# Patient Record
Sex: Female | Born: 1935
Health system: Southern US, Community
[De-identification: ages and names within clinical notes are randomized; demographics above are authoritative.]

## PROBLEM LIST (undated history)

## (undated) DIAGNOSIS — K219 Gastro-esophageal reflux disease without esophagitis: Secondary | ICD-10-CM

## (undated) DIAGNOSIS — R55 Syncope and collapse: Secondary | ICD-10-CM

## (undated) DIAGNOSIS — H409 Unspecified glaucoma: Secondary | ICD-10-CM

## (undated) HISTORY — PX: ABDOMINAL HYSTERECTOMY: SHX81

## (undated) HISTORY — PX: EYE SURGERY: SHX253

---

## 2004-10-13 ENCOUNTER — Ambulatory Visit: Payer: Self-pay | Admitting: Internal Medicine

## 2005-10-27 ENCOUNTER — Ambulatory Visit: Payer: Self-pay | Admitting: Internal Medicine

## 2006-02-10 ENCOUNTER — Ambulatory Visit: Payer: Self-pay | Admitting: Internal Medicine

## 2006-11-09 ENCOUNTER — Ambulatory Visit: Payer: Self-pay | Admitting: Internal Medicine

## 2007-11-15 ENCOUNTER — Ambulatory Visit: Payer: Self-pay | Admitting: Internal Medicine

## 2009-01-01 ENCOUNTER — Ambulatory Visit: Payer: Self-pay | Admitting: Internal Medicine

## 2009-04-03 ENCOUNTER — Ambulatory Visit: Payer: Self-pay | Admitting: Unknown Physician Specialty

## 2009-10-07 ENCOUNTER — Inpatient Hospital Stay: Payer: Self-pay | Admitting: Internal Medicine

## 2010-01-06 ENCOUNTER — Ambulatory Visit: Payer: Self-pay | Admitting: Internal Medicine

## 2010-08-08 ENCOUNTER — Emergency Department: Payer: Self-pay | Admitting: Unknown Physician Specialty

## 2010-09-20 ENCOUNTER — Ambulatory Visit: Payer: Self-pay | Admitting: Ophthalmology

## 2011-01-17 ENCOUNTER — Ambulatory Visit: Payer: Self-pay | Admitting: Internal Medicine

## 2011-07-25 ENCOUNTER — Ambulatory Visit: Payer: Self-pay | Admitting: Internal Medicine

## 2011-07-28 ENCOUNTER — Ambulatory Visit: Payer: Self-pay | Admitting: Internal Medicine

## 2011-11-07 ENCOUNTER — Ambulatory Visit: Payer: Self-pay | Admitting: Otolaryngology

## 2012-01-26 ENCOUNTER — Ambulatory Visit: Payer: Self-pay | Admitting: Unknown Physician Specialty

## 2012-02-23 ENCOUNTER — Ambulatory Visit: Payer: Self-pay | Admitting: Internal Medicine

## 2013-05-08 ENCOUNTER — Ambulatory Visit: Payer: Self-pay | Admitting: Internal Medicine

## 2013-06-25 ENCOUNTER — Observation Stay: Payer: Self-pay | Admitting: Internal Medicine

## 2013-06-25 LAB — URINALYSIS, COMPLETE
Bacteria: NONE SEEN
Bilirubin,UR: NEGATIVE
Blood: NEGATIVE
Glucose,UR: NEGATIVE mg/dL (ref 0–75)
Ph: 7 (ref 4.5–8.0)
Protein: NEGATIVE
RBC,UR: 1 /HPF (ref 0–5)
Squamous Epithelial: 1

## 2013-06-25 LAB — COMPREHENSIVE METABOLIC PANEL
Albumin: 3.9 g/dL (ref 3.4–5.0)
Alkaline Phosphatase: 152 U/L — ABNORMAL HIGH (ref 50–136)
Anion Gap: 7 (ref 7–16)
BUN: 9 mg/dL (ref 7–18)
Calcium, Total: 9.4 mg/dL (ref 8.5–10.1)
Co2: 26 mmol/L (ref 21–32)
Creatinine: 0.73 mg/dL (ref 0.60–1.30)
EGFR (African American): >60
EGFR (Non-African Amer.): >60
Glucose: 135 mg/dL — ABNORMAL HIGH (ref 65–99)
Osmolality: 263 (ref 275–301)
Potassium: 4 mmol/L (ref 3.5–5.1)
Sodium: 131 mmol/L — ABNORMAL LOW (ref 136–145)
Total Protein: 8 g/dL (ref 6.4–8.2)

## 2013-06-25 LAB — CK TOTAL AND CKMB (NOT AT ARMC): CK, Total: 44 U/L (ref 21–215)

## 2013-06-25 LAB — CBC
HCT: 43.2 % (ref 35.0–47.0)
MCHC: 33.8 g/dL (ref 32.0–36.0)
RBC: 4.61 10*6/uL (ref 3.80–5.20)

## 2013-06-25 LAB — TROPONIN I: Troponin-I: <0.02 ng/mL

## 2013-06-26 DIAGNOSIS — R55 Syncope and collapse: Secondary | ICD-10-CM

## 2013-06-26 LAB — CBC WITH DIFFERENTIAL/PLATELET
HCT: 35.7 % (ref 35.0–47.0)
HGB: 12.5 g/dL (ref 12.0–16.0)
Lymphocyte %: 16 %
MCHC: 35 g/dL (ref 32.0–36.0)
MCV: 95 fL (ref 80–100)
Monocyte %: 11.2 %
Neutrophil #: 4.6 10*3/uL (ref 1.4–6.5)
RBC: 3.76 10*6/uL — ABNORMAL LOW (ref 3.80–5.20)
RDW: 13 % (ref 11.5–14.5)
WBC: 6.6 10*3/uL (ref 3.6–11.0)

## 2013-06-26 LAB — BASIC METABOLIC PANEL
Anion Gap: 3 — ABNORMAL LOW (ref 7–16)
Calcium, Total: 8.4 mg/dL — ABNORMAL LOW (ref 8.5–10.1)
Chloride: 103 mmol/L (ref 98–107)
Co2: 28 mmol/L (ref 21–32)
Creatinine: 0.67 mg/dL (ref 0.60–1.30)
EGFR (Non-African Amer.): >60
Potassium: 3.9 mmol/L (ref 3.5–5.1)
Sodium: 134 mmol/L — ABNORMAL LOW (ref 136–145)

## 2013-06-26 LAB — MAGNESIUM: Magnesium: 1.8 mg/dL

## 2013-06-26 LAB — PROTIME-INR: Prothrombin Time: 14.2 secs (ref 11.5–14.7)

## 2013-06-30 LAB — CULTURE, BLOOD (SINGLE)

## 2014-07-06 ENCOUNTER — Emergency Department: Payer: Self-pay | Admitting: Emergency Medicine

## 2014-07-06 LAB — PRO B NATRIURETIC PEPTIDE: B-TYPE NATIURETIC PEPTID: 264 pg/mL (ref 0–450)

## 2014-07-06 LAB — CBC
HCT: 47.7 % — ABNORMAL HIGH (ref 35.0–47.0)
HGB: 15.6 g/dL (ref 12.0–16.0)
MCH: 31.4 pg (ref 26.0–34.0)
MCHC: 32.6 g/dL (ref 32.0–36.0)
MCV: 96 fL (ref 80–100)
Platelet: 250 10*3/uL (ref 150–440)
RBC: 4.96 10*6/uL (ref 3.80–5.20)
RDW: 13.1 % (ref 11.5–14.5)
WBC: 6.1 10*3/uL (ref 3.6–11.0)

## 2014-07-06 LAB — COMPREHENSIVE METABOLIC PANEL
ALBUMIN: 3.9 g/dL (ref 3.4–5.0)
ANION GAP: 9 (ref 7–16)
AST: 29 U/L (ref 15–37)
Alkaline Phosphatase: 137 U/L — ABNORMAL HIGH
BILIRUBIN TOTAL: 0.6 mg/dL (ref 0.2–1.0)
BUN: 10 mg/dL (ref 7–18)
CALCIUM: 9.5 mg/dL (ref 8.5–10.1)
CHLORIDE: 96 mmol/L — AB (ref 98–107)
Co2: 28 mmol/L (ref 21–32)
Creatinine: 0.83 mg/dL (ref 0.60–1.30)
GLUCOSE: 95 mg/dL (ref 65–99)
Osmolality: 265 (ref 275–301)
POTASSIUM: 3.9 mmol/L (ref 3.5–5.1)
SGPT (ALT): 30 U/L
Sodium: 133 mmol/L — ABNORMAL LOW (ref 136–145)
Total Protein: 8.4 g/dL — ABNORMAL HIGH (ref 6.4–8.2)

## 2014-07-06 LAB — URINALYSIS, COMPLETE
BLOOD: NEGATIVE
Bacteria: NONE SEEN
Bilirubin,UR: NEGATIVE
Glucose,UR: NEGATIVE mg/dL (ref 0–75)
Ketone: NEGATIVE
Nitrite: NEGATIVE
PH: 7 (ref 4.5–8.0)
PROTEIN: NEGATIVE
RBC,UR: 4 /HPF (ref 0–5)
Specific Gravity: 1.017 (ref 1.003–1.030)
Squamous Epithelial: 3
WBC UR: 118 /HPF (ref 0–5)

## 2014-07-06 LAB — TROPONIN I: Troponin-I: <0.02 ng/mL

## 2014-07-08 LAB — URINE CULTURE

## 2014-08-14 ENCOUNTER — Ambulatory Visit: Payer: Self-pay | Admitting: Internal Medicine

## 2015-03-20 NOTE — Consult Note (Signed)
Brief Consult Note: Diagnosis: Syncope.   Patient was seen by consultant.   Consult note dictated.   Recommend further assessment or treatment.   Orders entered.   Discussed with Attending MD.   Comments: IMP CHF on CXR Syncope Probable vasovagal uti? GERD Dehydration . PLAN Tele ROMI ECHO Carotid dopplers Agree with Ct of brain Rec conservative cardiacinvolvement for now No clear indication for EP or PPM.  Electronic Signatures: Lujean Amel D (MD)  (Signed 30-Jul-14 07:29)  Authored: Brief Consult Note   Last Updated: 30-Jul-14 07:29 by Yolonda Kida (MD)

## 2015-03-20 NOTE — H&P (Signed)
PATIENT NAME:  Robin Fernandez, Robin Fernandez MR#:  [id number] DATE OF BIRTH:  1929/08/18  DATE OF ADMISSION:  06/25/2013  PRIMARY CARE PHYSICIAN: Used to be Dr. Apolonio Schneiders, but the patient got an appointment to see Dr. Emily Filbert.  CHIEF COMPLAINT:  Syncope.   HISTORY OF PRESENT ILLNESS: This is an 81 year old female with history of GERD, recently with antibiotics for a UTI.  Finished Macrodantin for 7 days yesterday. Last night the patient woke up to use the bathroom.  After she urinated, she felt like she had fever so she checked the temperature; it was around 100.4. She wanted to note down it, but she passed out. The patient lost consciousness for a few seconds.  Husband was there at bedside. The patient was brought in for that. The patient does not have any orthostatic blood pressure changes here, but feels very tired. Denies any chest pain. No trouble breathing. No dizziness. Now the patient is in sinus rhythm with rate 90 beats per minute. The patient is alert, awake and oriented. Does not have any weakness or dysphagia or facial droop.    PAST MEDICAL HISTORY:  Significant for GERD and glaucoma.   ALLERGIES: AMOXicillin,> ERYTHROMYCIN, LUMIGAN, PARAFON FORTE, PREVACID AND XALATAN.   PAST SURGICAL HISTORY: Significant for a hysterectomy and glaucoma surgery.   FAMILY HISTORY: Significant for father who had a massive MI.  Mother had pacemaker.   SOCIAL HISTORY:  No smoking.  Occasional drinking. No drugs. The patient right now lives with husband.   MEDICATIONS:  Used to be on blood pressure medications, but they were stopped.  HOME MEDICATIONS: Include: 1.  Omeprazole 20 mg delayed release daily. 2.  Travatan 0.004% eye drops in affected eye in the evening.   REVIEW OF SYSTEMS: CONSTITUTIONAL: Feels weak and also has noticed low-grade temp last night.  EYES: No blurred vision. The patient felt dizzy last night.  ENT: No tinnitus. No epistaxis. No difficulty swallowing.  RESPIRATIONS: Has  chronic cough because of GERD.  CARDIOVASCULAR: No chest pain. No orthopnea.  GASTROINTESTINAL: No nausea. No vomiting. No abdominal pain.  GENITOURINARY: No dysuria.  Recently treated with antibiotics for UTI.  INTEGUMENT: No skin rash.  MUSCULOSKELETAL: No joint pain.  NEUROLOGIC: No numbness. No weakness. No dysarthria.  PSYCHIATRIC: No anxiety or insomnia.   PHYSICAL EXAMINATION: VITAL SIGNS: Temperature 99.3, heart rate 94, blood pressure 156/81, sats 97% on room air.  GENERAL: Alert, awake and oriented. Not in distress. Well-developed, well-nourished female.  HEAD:  Normocephalic, atraumatic.  EYES: Pallor.  EOMs intact. No conjunctival pallor. No scleral icterus.  NOSE: No nasal lesions.  EARS: No drainage. No external lesions. No exudates.  NECK: Supple. No JVD. Nontender. Full range of motion. LUNGS:  Clear to auscultation. No wheeze. No rales.  HEART:  S1 and S2 regular, slightly tachycardic. No murmurs. PMI not displaced. No peripheral edema. Good femoral and pedal pulses. ABDOMEN:  Soft, slightly distended. Bowel sounds present. No organomegaly.  MUSCULOSKELETAL: The patient's extremities move x 4.  Range of motion is adequate. Strength is equal bilaterally.  SKIN: Warm and dry. No diaphoresis. No obvious wounds.  NEUROLOGIC: Alert, awake and oriented. Cranial nerves II through XII intact. Power 5/5 in upper and lower extremities. Sensation is intact. DTRs 2+ bilaterally. Power 5/5 in upper and lower extremities. The patient has no dysarthria.  PSYCHIATRIC: Oriented to time, place and person.   LABORATORY AND DIAGNOSTICS:  The patient's troponin is less than 0.02. CK total is 44, CPK/MB 1.5. WBC 10.7,  hemoglobin 14.6, hematocrit 43.2, platelets 228.   Chest x-ray shows mild CHF versus pneumonitis.  CAT scan of the head shows no acute intracranial abnormality.   Urinalysis is clear with 1+ leukocyte esterase, but bacteria none. WBC is only 2.   EKG: Sinus rhythm with some  PVCs, 98 beats per minute. No ST-T changes.   ASSESSMENT AND PLAN: 1.  The patient is an 81 year old female with syncope likely secondary to vasovagal.  The patient's urine looks clear. No evidence of infection. We are going to evaluate her for 24 hours and check orthostatic vitals.  Also evaluate for occult infection.  The patient complains of some cough and pneumonitis found on x-ray, but she has already received nitrofurantoin and she is started on Rocephin by ER physician. We can probably continue the Rocephin for 2 days and see how she does.  2.  Gastroesophageal reflux disease. Continue proton pump inhibitors. 3.  The patient has glaucoma. Continue home medications.   4.  Regarding syncope, we will get an echocardiogram and carotid ultrasound. We will hold off on the cardiology consult at this time.   The patient's condition discussed with the daughter.  Will sign off to Dr. Emily Filbert.  TIME SPENT: 55 minutes. ____________________________ Epifanio Lesches, MD sk:sb D: 06/25/2013 12:51:42 ET T: 06/25/2013 13:04:20 ET JOB#: [id number]  cc: Epifanio Lesches, MD, <Dictator> Epifanio Lesches MD ELECTRONICALLY SIGNED 07/16/2013 16:02

## 2015-03-20 NOTE — Discharge Summary (Signed)
PATIENT NAME:  Robin Fernandez, Robin Fernandez MR#:  [id number] DATE OF BIRTH:  1929/07/27  DATE OF ADMISSION:  06/25/2013 DATE OF DISCHARGE:    DISCHARGE DIAGNOSES:  1.  Syncope, vasovagal.  2.  Fever with urinary tract infection.  3.  Reflux esophagitis.  4.  Irritable bowel syndrome.  5.  Chronically elevated alkaline phosphatase.  6.  Glaucoma.   DISCHARGE MEDICATIONS: Travatan 0.004%, 1 drop to left eye daily, omeprazole 20 mg daily and Cipro 250 mg twice daily x 7 days.   REASON FOR ADMISSION: This 81 year old female presents with syncope and fever. Please see H and P for history of present illness, past medical history and physical exam.   HOSPITAL COURSE: The patient was admitted, ruled out for MI. Lab work normal. Her orthostasis resolved with hydration. She was stable on her feet with physical therapy. Urinary tract infection was treated with Rocephin and then she will be going home on Cipro. No pneumonia. No CHF.   PROGNOSIS: Good.   FOLLOWUP: In 1 week.  ____________________________ Rusty Aus, MD mfm:aw D: 06/27/2013 07:57:25 ET T: 06/27/2013 08:04:16 ET JOB#: [id number]  cc: Rusty Aus, MD, <Dictator> MARK Roselee Culver MD ELECTRONICALLY SIGNED 06/28/2013 8:14

## 2016-01-06 DIAGNOSIS — H40053 Ocular hypertension, bilateral: Secondary | ICD-10-CM | POA: Diagnosis not present

## 2016-01-21 DIAGNOSIS — M79674 Pain in right toe(s): Secondary | ICD-10-CM | POA: Diagnosis not present

## 2016-01-21 DIAGNOSIS — M2041 Other hammer toe(s) (acquired), right foot: Secondary | ICD-10-CM | POA: Diagnosis not present

## 2016-01-21 DIAGNOSIS — B351 Tinea unguium: Secondary | ICD-10-CM | POA: Diagnosis not present

## 2016-01-21 DIAGNOSIS — M79675 Pain in left toe(s): Secondary | ICD-10-CM | POA: Diagnosis not present

## 2016-01-21 DIAGNOSIS — M2042 Other hammer toe(s) (acquired), left foot: Secondary | ICD-10-CM | POA: Diagnosis not present

## 2016-02-08 ENCOUNTER — Emergency Department
Admission: EM | Admit: 2016-02-08 | Discharge: 2016-02-08 | Disposition: A | Discharge status: Home / Self Care | Payer: PPO | Attending: Emergency Medicine | Admitting: Emergency Medicine

## 2016-02-08 ENCOUNTER — Encounter: Payer: Self-pay | Admitting: Emergency Medicine

## 2016-02-08 DIAGNOSIS — E538 Deficiency of other specified B group vitamins: Secondary | ICD-10-CM | POA: Diagnosis not present

## 2016-02-08 DIAGNOSIS — Z87891 Personal history of nicotine dependence: Secondary | ICD-10-CM | POA: Diagnosis not present

## 2016-02-08 DIAGNOSIS — I10 Essential (primary) hypertension: Secondary | ICD-10-CM | POA: Diagnosis not present

## 2016-02-08 DIAGNOSIS — R55 Syncope and collapse: Secondary | ICD-10-CM | POA: Diagnosis not present

## 2016-02-08 HISTORY — DX: Gastro-esophageal reflux disease without esophagitis: K21.9

## 2016-02-08 HISTORY — DX: Unspecified glaucoma: H40.9

## 2016-02-08 LAB — CBC
HCT: 38.9 % (ref 35.0–47.0)
HEMOGLOBIN: 13.3 g/dL (ref 12.0–16.0)
MCH: 32.2 pg (ref 26.0–34.0)
MCHC: 34.2 g/dL (ref 32.0–36.0)
MCV: 94.3 fL (ref 80.0–100.0)
PLATELETS: 222 10*3/uL (ref 150–440)
RBC: 4.13 MIL/uL (ref 3.80–5.20)
RDW: 12.9 % (ref 11.5–14.5)
WBC: 4.8 10*3/uL (ref 3.6–11.0)

## 2016-02-08 LAB — COMPREHENSIVE METABOLIC PANEL
ALK PHOS: 99 U/L (ref 38–126)
ALT: 18 U/L (ref 14–54)
AST: 29 U/L (ref 15–41)
Albumin: 3.7 g/dL (ref 3.5–5.0)
Anion gap: 7 (ref 5–15)
BUN: 15 mg/dL (ref 6–20)
CALCIUM: 8.8 mg/dL — AB (ref 8.9–10.3)
CO2: 27 mmol/L (ref 22–32)
CREATININE: 0.74 mg/dL (ref 0.44–1.00)
Chloride: 100 mmol/L — ABNORMAL LOW (ref 101–111)
Glucose, Bld: 127 mg/dL — ABNORMAL HIGH (ref 65–99)
Potassium: 3.3 mmol/L — ABNORMAL LOW (ref 3.5–5.1)
Sodium: 134 mmol/L — ABNORMAL LOW (ref 135–145)
Total Bilirubin: 1 mg/dL (ref 0.3–1.2)
Total Protein: 6.6 g/dL (ref 6.5–8.1)

## 2016-02-08 LAB — TROPONIN I

## 2016-02-08 MED ORDER — SODIUM CHLORIDE 0.9 % IV SOLN
1000.0000 mL | Freq: Once | INTRAVENOUS | Status: AC
  Administered 2016-02-08: 1000 mL via INTRAVENOUS

## 2016-02-08 NOTE — Discharge Instructions (Signed)
Syncope, commonly known as fainting, is a temporary loss of consciousness. It occurs when the blood flow to the brain is reduced. Vasovagal syncope (also called neurocardiogenic syncope) is a fainting spell in which the blood flow to the brain is reduced because of a sudden drop in heart rate and blood pressure. Vasovagal syncope occurs when the brain and the cardiovascular system (blood vessels) do not adequately communicate and respond to each other. This is the most common cause of fainting. It often occurs in response to fear or some other type of emotional or physical stress. The body has a reaction in which the heart starts beating too slowly or the blood vessels expand, reducing blood pressure. This type of fainting spell is generally considered harmless. However, injuries can occur if a person takes a sudden fall during a fainting spell.   CAUSES   Vasovagal syncope occurs when a person's blood pressure and heart rate decrease suddenly, usually in response to a trigger. Many things and situations can trigger an episode. Some of these include:    Pain.    Fear.    The sight of blood or medical procedures, such as blood being drawn from a vein.    Common activities, such as coughing, swallowing, stretching, or going to the bathroom.    Emotional stress.    Prolonged standing, especially in a warm environment.    Lack of sleep or rest.    Prolonged lack of food.    Prolonged lack of fluids.    Recent illness.   The use of certain drugs that affect blood pressure, such as cocaine, alcohol, marijuana, inhalants, and opiates.   SYMPTOMS   Before the fainting episode, you may:    Feel dizzy or light headed.    Become pale.   Sense that you are going to faint.    Feel like the room is spinning.    Have tunnel vision, only seeing directly in front of you.    Feel sick to your stomach (nauseous).    See spots or slowly lose vision.    Hear ringing in your ears.    Have a headache.     Feel warm and sweaty.    Feel a sensation of pins and needles.  During the fainting spell, you will generally be unconscious for no longer than a couple minutes before waking up and returning to normal. If you get up too quickly before your body can recover, you may faint again. Some twitching or jerky movements may occur during the fainting spell.   DIAGNOSIS   Your health care provider will ask about your symptoms, take a medical history, and perform a physical exam. Various tests may be done to rule out other causes of fainting. These may include blood tests and tests to check the heart, such as electrocardiography, echocardiography, and possibly an electrophysiology study. When other causes have been ruled out, a test may be done to check the body's response to changes in position (tilt table test).  TREATMENT   Most cases of vasovagal syncope do not require treatment. Your health care provider may recommend ways to avoid fainting triggers and may provide home strategies for preventing fainting. If you must be exposed to a possible trigger, you can drink additional fluids to help reduce your chances of having an episode of vasovagal syncope. If you have warning signs of an oncoming episode, you can respond by positioning yourself favorably (lying down).  If your fainting spells continue, you may be   given medicines to prevent fainting. Some medicines may help make you more resistant to repeated episodes of vasovagal syncope. Special exercises or compression stockings may be recommended. In rare cases, the surgical placement of a pacemaker is considered.  HOME CARE INSTRUCTIONS    Learn to identify the warning signs of vasovagal syncope.    Sit or lie down at the first warning sign of a fainting spell. If sitting, put your head down between your legs. If you lie down, swing your legs up in the air to increase blood flow to the brain.    Avoid hot tubs and saunas.   Avoid prolonged standing.   Drink  enough fluids to keep your urine clear or pale yellow. Avoid caffeine.   Increase salt in your diet as directed by your health care provider.    If you have to stand for a long time, perform movements such as:     Crossing your legs.     Flexing and stretching your leg muscles.     Squatting.     Moving your legs.     Bending over.    Only take over-the-counter or prescription medicines as directed by your health care provider. Do not suddenly stop any medicines without asking your health care provider first.  SEEK MEDICAL CARE IF:    Your fainting spells continue or happen more frequently in spite of treatment.    You lose consciousness for more than a couple minutes.   You have fainting spells during or after exercising or after being startled.    You have new symptoms that occur with the fainting spells, such as:     Shortness of breath.    Chest pain.     Irregular heartbeat.    You have episodes of twitching or jerky movements that last longer than a few seconds.   You have episodes of twitching or jerky movements without obvious fainting.  SEEK IMMEDIATE MEDICAL CARE IF:    You have injuries or bleeding after a fainting spell.    You have episodes of twitching or jerky movements that last longer than 5 minutes.    You have more than one spell of twitching or jerky movements before returning to consciousness after fainting.     This information is not intended to replace advice given to you by your health care provider. Make sure you discuss any questions you have with your health care provider.     Document Released: 10/31/2012 Document Revised: 03/31/2015 Document Reviewed: 10/31/2012  Elsevier Interactive Patient Education 2016 [employer]

## 2016-02-08 NOTE — ED Notes (Signed)
Pt here visiting husband. Pt states had a bowel movement and passed out. Pt states has history of vasovagal reactions.

## 2016-02-08 NOTE — ED Provider Notes (Signed)
[facility] Emergency Department Provider Note  ____________________________________________    I have reviewed the triage vital signs and the nursing notes.   HISTORY  Chief Complaint Loss of Consciousness    HPI Robin Fernandez is a 82 y.o. female who presents after a syncopal episode. Patient was visiting her husband in the hospital and felt the need to have a bowel movement and when she was pushing down she became lightheaded and apparently syncopized. She denies chest pain or palpitations. She feels well now. She reports this happened several times and her doctor diagnosed her with vasovagal syncope. Typically this happens in relation to bowel movements for her   Past Medical History  Diagnosis Date  . Glaucoma   . GERD (gastroesophageal reflux disease)     There are no active problems to display for this patient.   Past Surgical History  Procedure Laterality Date  . Abdominal hysterectomy    . Eye surgery      No current outpatient prescriptions on file.  Allergies Review of patient's allergies indicates not on file.  No family history on file.  Social History Social History  Substance Use Topics  . Smoking status: Former Research scientist (life sciences)  . Smokeless tobacco: None  . Alcohol Use: No    Review of Systems  Constitutional: Negative for dizziness Eyes: Negative for redness ENT: Negative for sore throat Cardiovascular: Negative for chest pain area no palpitations Respiratory: Negative for shortness of breath. Gastrointestinal: Negative for abdominal pain Genitourinary: Negative for dysuria. Musculoskeletal: Negative for back pain. Skin: Negative for rash. Neurological: Negative for focal weakness Psychiatric: no anxiety    ____________________________________________   PHYSICAL EXAM:  VITAL SIGNS: ED Triage Vitals  Enc Vitals Group     BP 02/08/16 1217 117/106 mmHg     Pulse Rate 02/08/16 1217 67     Resp 02/08/16 1217 18   Temp 02/08/16 1217 98.6 F (37 C)     Temp src --      SpO2 02/08/16 1217 94 %     Weight 02/08/16 1217 112 lb (50.803 kg)     Height 02/08/16 1217 4\' 9"  (1.448 m)     Head Cir --      Peak Flow --      Pain Score --      Pain Loc --      Pain Edu? --      Excl. in [place]? --      Constitutional: Alert and oriented. Well appearing and in no distress. Pleasant and interactive  Eyes: Conjunctivae are normal. No erythema or injection ENT   Head: Normocephalic and atraumatic.   Mouth/Throat: Mucous membranes are moist. Cardiovascular: Normal rate, regular rhythm. Normal and symmetric distal pulses are present in the upper extremities.  Respiratory: Normal respiratory effort without tachypnea nor retractions. Breath sounds are clear and equal bilaterally.  Gastrointestinal: Soft and non-tender in all quadrants. No distention. There is no CVA tenderness. Genitourinary: deferred Musculoskeletal: Nontender with normal range of motion in all extremities. No lower extremity tenderness nor edema. Neurologic:  Normal speech and language. No gross focal neurologic deficits are appreciated. Skin:  Skin is warm, dry and intact. No rash noted. Psychiatric: Mood and affect are normal. Patient exhibits appropriate insight and judgment.  ____________________________________________    LABS (pertinent positives/negatives)  Labs Reviewed  COMPREHENSIVE METABOLIC PANEL - Abnormal; Notable for the following:    Sodium 134 (*)    Potassium 3.3 (*)    Chloride 100 (*)  Glucose, Bld 127 (*)    Calcium 8.8 (*)    All other components within normal limits  CBC  TROPONIN I    ____________________________________________   EKG  ED ECG REPORT I, Lavonia Drafts, the attending physician, personally viewed and interpreted this ECG.  Date: 02/08/2016 EKG Time: 12:21 PM Rate: 73 Rhythm: normal sinus rhythm QRS Axis: normal Intervals: normal ST/T Wave abnormalities: normal Conduction  Disturbances: none Narrative Interpretation: unremarkable   ____________________________________________    RADIOLOGY  None  ____________________________________________   PROCEDURES  Procedure(s) performed: none  Critical Care performed: none  ____________________________________________   INITIAL IMPRESSION / ASSESSMENT AND PLAN / ED COURSE  Pertinent labs & imaging results that were available during my care of the patient were reviewed by me and considered in my medical decision making (see chart for details).  Patient well-appearing and in no distress. History of present illness is consistent with vasovagal syncope, we will check labs, EKG and reevaluate. We will give a liter of IV fluids as well  Patient feeling well in no distress. Orthostatic vitals are normal, labs are unremarkable. Patient is appropriate for discharge  ____________________________________________   FINAL CLINICAL IMPRESSION(S) / ED DIAGNOSES  Final diagnoses:  Vasovagal syncope          Lavonia Drafts, MD 02/08/16 1431

## 2016-02-15 DIAGNOSIS — Z0000 Encounter for general adult medical examination without abnormal findings: Secondary | ICD-10-CM | POA: Diagnosis not present

## 2016-02-15 DIAGNOSIS — R55 Syncope and collapse: Secondary | ICD-10-CM | POA: Diagnosis not present

## 2016-02-15 DIAGNOSIS — J0100 Acute maxillary sinusitis, unspecified: Secondary | ICD-10-CM | POA: Diagnosis not present

## 2016-02-15 DIAGNOSIS — E538 Deficiency of other specified B group vitamins: Secondary | ICD-10-CM | POA: Diagnosis not present

## 2016-04-14 DIAGNOSIS — M2041 Other hammer toe(s) (acquired), right foot: Secondary | ICD-10-CM | POA: Diagnosis not present

## 2016-04-14 DIAGNOSIS — M2042 Other hammer toe(s) (acquired), left foot: Secondary | ICD-10-CM | POA: Diagnosis not present

## 2016-04-19 DIAGNOSIS — B354 Tinea corporis: Secondary | ICD-10-CM | POA: Diagnosis not present

## 2016-06-23 DIAGNOSIS — L400 Psoriasis vulgaris: Secondary | ICD-10-CM | POA: Diagnosis not present

## 2016-07-04 DIAGNOSIS — H40053 Ocular hypertension, bilateral: Secondary | ICD-10-CM | POA: Diagnosis not present

## 2016-08-10 DIAGNOSIS — J0100 Acute maxillary sinusitis, unspecified: Secondary | ICD-10-CM | POA: Diagnosis not present

## 2016-08-10 DIAGNOSIS — Z0000 Encounter for general adult medical examination without abnormal findings: Secondary | ICD-10-CM | POA: Diagnosis not present

## 2016-08-10 DIAGNOSIS — E538 Deficiency of other specified B group vitamins: Secondary | ICD-10-CM | POA: Diagnosis not present

## 2016-09-01 ENCOUNTER — Emergency Department
Admission: EM | Admit: 2016-09-01 | Discharge: 2016-09-01 | Disposition: A | Discharge status: Home / Self Care | Payer: PPO | Attending: Student | Admitting: Student

## 2016-09-01 ENCOUNTER — Emergency Department: Payer: PPO

## 2016-09-01 ENCOUNTER — Encounter: Payer: Self-pay | Admitting: Emergency Medicine

## 2016-09-01 DIAGNOSIS — Z87891 Personal history of nicotine dependence: Secondary | ICD-10-CM | POA: Insufficient documentation

## 2016-09-01 DIAGNOSIS — Z79899 Other long term (current) drug therapy: Secondary | ICD-10-CM | POA: Insufficient documentation

## 2016-09-01 DIAGNOSIS — R55 Syncope and collapse: Secondary | ICD-10-CM | POA: Diagnosis not present

## 2016-09-01 HISTORY — DX: Syncope and collapse: R55

## 2016-09-01 LAB — COMPREHENSIVE METABOLIC PANEL
ALT: 17 U/L (ref 14–54)
AST: 27 U/L (ref 15–41)
Albumin: 3.7 g/dL (ref 3.5–5.0)
Alkaline Phosphatase: 96 U/L (ref 38–126)
Anion gap: 7 (ref 5–15)
BILIRUBIN TOTAL: 0.6 mg/dL (ref 0.3–1.2)
BUN: 10 mg/dL (ref 6–20)
CHLORIDE: 97 mmol/L — AB (ref 101–111)
CO2: 26 mmol/L (ref 22–32)
CREATININE: 0.59 mg/dL (ref 0.44–1.00)
Calcium: 8.8 mg/dL — ABNORMAL LOW (ref 8.9–10.3)
Glucose, Bld: 136 mg/dL — ABNORMAL HIGH (ref 65–99)
Potassium: 3.4 mmol/L — ABNORMAL LOW (ref 3.5–5.1)
Sodium: 130 mmol/L — ABNORMAL LOW (ref 135–145)
TOTAL PROTEIN: 6.7 g/dL (ref 6.5–8.1)

## 2016-09-01 LAB — CBC WITH DIFFERENTIAL/PLATELET
Basophils Absolute: 0.1 10*3/uL (ref 0–0.1)
Basophils Relative: 1 %
EOS PCT: 1 %
Eosinophils Absolute: 0 10*3/uL (ref 0–0.7)
HEMATOCRIT: 40.5 % (ref 35.0–47.0)
Hemoglobin: 14 g/dL (ref 12.0–16.0)
LYMPHS ABS: 1.2 10*3/uL (ref 1.0–3.6)
LYMPHS PCT: 20 %
MCH: 32.3 pg (ref 26.0–34.0)
MCHC: 34.7 g/dL (ref 32.0–36.0)
MCV: 93.3 fL (ref 80.0–100.0)
MONO ABS: 0.7 10*3/uL (ref 0.2–0.9)
Monocytes Relative: 12 %
NEUTROS ABS: 4 10*3/uL (ref 1.4–6.5)
Neutrophils Relative %: 66 %
PLATELETS: 222 10*3/uL (ref 150–440)
RBC: 4.34 MIL/uL (ref 3.80–5.20)
RDW: 12.9 % (ref 11.5–14.5)
WBC: 6 10*3/uL (ref 3.6–11.0)

## 2016-09-01 LAB — URINALYSIS COMPLETE WITH MICROSCOPIC (ARMC ONLY)
BILIRUBIN URINE: NEGATIVE
Glucose, UA: NEGATIVE mg/dL
HGB URINE DIPSTICK: NEGATIVE
LEUKOCYTES UA: NEGATIVE
Nitrite: NEGATIVE
PH: 7 (ref 5.0–8.0)
Protein, ur: NEGATIVE mg/dL
Specific Gravity, Urine: 1.005 (ref 1.005–1.030)

## 2016-09-01 LAB — TROPONIN I: Troponin I: <0.03 ng/mL (ref <0.03)

## 2016-09-01 LAB — GLUCOSE, CAPILLARY: Glucose-Capillary: 129 mg/dL — ABNORMAL HIGH (ref 65–99)

## 2016-09-01 LAB — LIPASE, BLOOD: LIPASE: 29 U/L (ref 11–51)

## 2016-09-01 MED ORDER — SODIUM CHLORIDE 0.9 % IV BOLUS (SEPSIS)
1000.0000 mL | Freq: Once | INTRAVENOUS | Status: AC
  Administered 2016-09-01: 1000 mL via INTRAVENOUS

## 2016-09-01 NOTE — ED Notes (Signed)
This RN to bedside to discuss patient concerns. Pt states that she takes 1/2 of a HCTZ 12.5mg  tablet every night. Pt is in NAD at this time. Will continue to monitor for further patient needs.

## 2016-09-01 NOTE — ED Notes (Signed)
NAD noted at time of D/C. Pt denies questions or concerns. Pt taken upstairs to room 134 where her husband is a patient, taken upstairs by a caregiver.

## 2016-09-01 NOTE — ED Triage Notes (Signed)
Pt presents from room 134 where she was visiting another patient. Pt states had 2 "big bowel movements this morning and had another one while she was sitting in a chair". Pt states she told the CNA that she did not feel good and she passed out. Report received from [place], [place] that patient had poor PO intake yesterday, pt states hx of vasovagal syncope in the past.

## 2016-09-01 NOTE — ED Provider Notes (Signed)
[facility] Emergency Department Provider Note   ____________________________________________   First MD Initiated Contact with Patient 09/01/16 [phone]     (approximate)  I have reviewed the triage vital signs and the nursing notes.   HISTORY  Chief Complaint Loss of Consciousness    HPI Robin Fernandez is a 82 y.o. female with history of GERD, glaucoma, multiple prior syncopal episodes related to vasovagal syncope who presents for evaluation of a syncopal episode which occurred just prior to arrival, sudden onset, resolved, no modifying factors. The patient was upstairs with her husband who is currently a patient scheduled for amputation. She had "2 big bowel movements this morning" were formed stool, nonbloody and reports that she has not been drinking water the way she should. While she was upstairs, she was sitting in a chair and reported to her CNA that she was feeling lightheaded and like she felt as if she were going to pass out. She didn't faint but she did not hit her head or injure herself. Currently she feels "washed out" but denies any chest pain, difficulty breathing, no abdominal pain, no vomiting, diarrhea, fevers or chills. She reports this has happened to her many times before. She is eager to get out of the emergency department and return to her husband who scheduled for his procedure at noon today.   Past Medical History:  Diagnosis Date  . GERD (gastroesophageal reflux disease)   . Glaucoma   . Vasovagal syncope     There are no active problems to display for this patient.   Past Surgical History:  Procedure Laterality Date  . ABDOMINAL HYSTERECTOMY    . EYE SURGERY      Prior to Admission medications   Medication Sig Start Date End Date Taking? Authorizing Provider  omeprazole (PRILOSEC) 20 MG capsule Take 20 mg by mouth daily.   Yes Historical Provider, MD  Travoprost, BAK Free, (TRAVATAN) 0.004 % SOLN ophthalmic solution Place 1  drop into the left eye at bedtime.   Yes Historical Provider, MD    Allergies Amoxil [amoxicillin]; Azopt [brinzolamide]; Deltasone [prednisone]; Erythromycin; Lumigan [bimatoprost]; Parafon forte dsc [chlorzoxazone]; and Prevacid [lansoprazole]  History reviewed. No pertinent family history.  Social History Social History  Substance Use Topics  . Smoking status: Former Research scientist (life sciences)  . Smokeless tobacco: Not on file  . Alcohol use No    Review of Systems Constitutional: No fever/chills Eyes: No visual changes. ENT: No sore throat. Cardiovascular: Denies chest pain. Respiratory: Denies shortness of breath. Gastrointestinal: No abdominal pain.  No nausea, no vomiting.  No diarrhea.  No constipation. Genitourinary: Negative for dysuria. Musculoskeletal: Negative for back pain. Skin: Negative for rash. Neurological: Negative for headaches, focal weakness or numbness.  10-point ROS otherwise negative.  ____________________________________________   PHYSICAL EXAM:  Vitals:   09/01/16 1316 09/01/16 1317 09/01/16 1330 09/01/16 1400  BP: 129/70 134/66 131/68 (!) 140/59  Pulse: 81 82 82 74  Resp: (!) 24 (!) 33 (!) 21 13  Temp:      TempSrc:      SpO2: 97% 99% 98% 96%  Weight:      Height:        Vitals:   09/01/16 1316 09/01/16 1317 09/01/16 1330 09/01/16 1400  BP: 129/70 134/66 131/68 (!) 140/59  Pulse: 81 82 82 74  Resp: (!) 24 (!) 33 (!) 21 13  Temp:      TempSrc:      SpO2: 97% 99% 98% 96%  Weight:  Height:        VITAL SIGNS: ED Triage Vitals [09/01/16 0950]  Enc Vitals Group     BP (!) 127/58     Pulse Rate 75     Resp 14     Temp      Temp src      SpO2 99 %     Weight      Height      Head Circumference      Peak Flow      Pain Score      Pain Loc      Pain Edu?      Excl. in [place]?     Constitutional: Alert and oriented. Well appearing and in no acute distress. Eyes: Conjunctivae are normal. PERRL. EOMI. Head: Atraumatic. Nose: No  congestion/rhinnorhea. Mouth/Throat: Mucous membranes are moist.  Oropharynx non-erythematous. Neck: No stridor.  Supple without meningismus. Cardiovascular: Normal rate, regular rhythm. Grossly normal heart sounds.  Good peripheral circulation. Respiratory: Normal respiratory effort.  No retractions. Lungs CTAB. Gastrointestinal: Soft and nontender. No distention.  No CVA tenderness. Genitourinary: deferred Musculoskeletal: No lower extremity tenderness nor edema.  No joint effusions. Neurologic:  Normal speech and language. No gross focal neurologic deficits are appreciated. No gait instability. 5 out of 5 strength in bilateral upper and lower extremities, sensation intact to light touch throughout. Skin:  Skin is warm, dry and intact. No rash noted. Psychiatric: Mood and affect are normal. Speech and behavior are normal.  ____________________________________________   LABS (all labs ordered are listed, but only abnormal results are displayed)  Labs Reviewed  COMPREHENSIVE METABOLIC PANEL - Abnormal; Notable for the following:       Result Value   Sodium 130 (*)    Potassium 3.4 (*)    Chloride 97 (*)    Glucose, Bld 136 (*)    Calcium 8.8 (*)    All other components within normal limits  URINALYSIS COMPLETEWITH MICROSCOPIC ([facility] ONLY) - Abnormal; Notable for the following:    Color, Urine YELLOW (*)    APPearance CLEAR (*)    Ketones, ur TRACE (*)    Bacteria, UA RARE (*)    Squamous Epithelial / LPF 0-5 (*)    All other components within normal limits  GLUCOSE, CAPILLARY - Abnormal; Notable for the following:    Glucose-Capillary 129 (*)    All other components within normal limits  CBC WITH DIFFERENTIAL/PLATELET  LIPASE, BLOOD  TROPONIN I   ____________________________________________  EKG  ED ECG REPORT I, Joanne Gavel, the attending physician, personally viewed and interpreted this ECG.   Date: 09/01/2016  EKG Time: 09:50  Rate: 74  Rhythm: normal sinus  rhythm  Axis: normal  Intervals:none  ST&T Change: No acute ST elevation or acute ST depression. Nonspecific T-wave abnormality in 1 and aVL.Unchanged from 02/08/16.  ____________________________________________  RADIOLOGY  CXR IMPRESSION:  No acute cardiopulmonary disease.      ____________________________________________   PROCEDURES  Procedure(s) performed: None  Procedures  Critical Care performed: No  ____________________________________________   INITIAL IMPRESSION / ASSESSMENT AND PLAN / ED COURSE  Pertinent labs & imaging results that were available during my care of the patient were reviewed by me and considered in my medical decision making (see chart for details).  Robin Fernandez is a 82 y.o. female with history of GERD, glaucoma, multiple prior syncopal episodes related to vasovagal syncope who presents for evaluation of a syncopal episode which occurred just prior to arrival, sudden onset, resolved,  no modifying factors. On exam, she is well-appearing in no acute distress. Vital signs stable, she is afebrile. She has a benign physical examination. Suspect recurrent vasovagal syncope given history of same. Doubt purely cardiogenic or neurogenic cause of syncope. She has an intact neurological examination. EKG is unchanged from prior. We'll give IV fluids, plan for screening labs, urinalysis, chest x-ray and reassess for disposition.  ----------------------------------------- 2:11 PM on 09/01/2016 ----------------------------------------- Patient reports she feels much better at this time. Labs notable for very mild hyponatremia sodium of 130. Unremarkable CBC, negative troponin. UA not consistent with infection. Chest x-ray clear. Discussed return precautions and need for close PCP follow-up and she is comfortable with the discharge plan. DC home. Her vital signs are stable at the time of discharge, elevated respiratory rates which were previously documented were  spurrious according to patient's nurse and clinically, she has never appeared tachypneic here.  Clinical Course     ____________________________________________   FINAL CLINICAL IMPRESSION(S) / ED DIAGNOSES  Final diagnoses:  Syncope, unspecified syncope type      NEW MEDICATIONS STARTED DURING THIS VISIT:  New Prescriptions   No medications on file     Note:  This document was prepared using Dragon voice recognition software and may include unintentional dictation errors.    Joanne Gavel, MD 09/01/16 [phone]

## 2016-09-01 NOTE — ED Notes (Signed)
This RN assisted patient to the bathroom at this time for urine sample. Pt tolerating well at this time.

## 2016-10-03 DIAGNOSIS — H40053 Ocular hypertension, bilateral: Secondary | ICD-10-CM | POA: Diagnosis not present

## 2016-10-11 DIAGNOSIS — M2041 Other hammer toe(s) (acquired), right foot: Secondary | ICD-10-CM | POA: Diagnosis not present

## 2016-10-11 DIAGNOSIS — M2042 Other hammer toe(s) (acquired), left foot: Secondary | ICD-10-CM | POA: Diagnosis not present

## 2016-10-19 ENCOUNTER — Other Ambulatory Visit: Payer: Self-pay | Admitting: Internal Medicine

## 2016-10-19 DIAGNOSIS — G451 Carotid artery syndrome (hemispheric): Secondary | ICD-10-CM | POA: Diagnosis not present

## 2016-10-19 DIAGNOSIS — R55 Syncope and collapse: Secondary | ICD-10-CM | POA: Diagnosis not present

## 2016-10-19 DIAGNOSIS — E871 Hypo-osmolality and hyponatremia: Secondary | ICD-10-CM | POA: Diagnosis not present

## 2016-10-27 ENCOUNTER — Ambulatory Visit
Admission: RE | Admit: 2016-10-27 | Discharge: 2016-10-27 | Disposition: A | Discharge status: Home / Self Care | Payer: PPO | Source: Ambulatory Visit | Attending: Internal Medicine | Admitting: Internal Medicine

## 2016-10-27 DIAGNOSIS — G451 Carotid artery syndrome (hemispheric): Secondary | ICD-10-CM | POA: Diagnosis not present

## 2016-10-27 DIAGNOSIS — H538 Other visual disturbances: Secondary | ICD-10-CM | POA: Diagnosis not present

## 2016-10-27 DIAGNOSIS — R410 Disorientation, unspecified: Secondary | ICD-10-CM | POA: Diagnosis not present

## 2016-10-27 DIAGNOSIS — E041 Nontoxic single thyroid nodule: Secondary | ICD-10-CM | POA: Insufficient documentation

## 2016-10-27 DIAGNOSIS — I6523 Occlusion and stenosis of bilateral carotid arteries: Secondary | ICD-10-CM | POA: Diagnosis not present

## 2016-11-01 DIAGNOSIS — R300 Dysuria: Secondary | ICD-10-CM | POA: Diagnosis not present

## 2016-11-15 DIAGNOSIS — R300 Dysuria: Secondary | ICD-10-CM | POA: Diagnosis not present

## 2017-02-07 DIAGNOSIS — R55 Syncope and collapse: Secondary | ICD-10-CM | POA: Diagnosis not present

## 2017-02-09 DIAGNOSIS — Z0000 Encounter for general adult medical examination without abnormal findings: Secondary | ICD-10-CM | POA: Diagnosis not present

## 2017-02-09 DIAGNOSIS — E538 Deficiency of other specified B group vitamins: Secondary | ICD-10-CM | POA: Diagnosis not present

## 2017-02-09 DIAGNOSIS — H6122 Impacted cerumen, left ear: Secondary | ICD-10-CM | POA: Diagnosis not present

## 2017-03-08 DIAGNOSIS — I10 Essential (primary) hypertension: Secondary | ICD-10-CM | POA: Diagnosis not present

## 2017-03-22 DIAGNOSIS — H40053 Ocular hypertension, bilateral: Secondary | ICD-10-CM | POA: Diagnosis not present

## 2017-04-11 DIAGNOSIS — S90122A Contusion of left lesser toe(s) without damage to nail, initial encounter: Secondary | ICD-10-CM | POA: Diagnosis not present

## 2017-04-11 DIAGNOSIS — M2042 Other hammer toe(s) (acquired), left foot: Secondary | ICD-10-CM | POA: Diagnosis not present

## 2017-04-11 DIAGNOSIS — M2041 Other hammer toe(s) (acquired), right foot: Secondary | ICD-10-CM | POA: Diagnosis not present

## 2017-05-04 ENCOUNTER — Encounter: Payer: Self-pay | Admitting: Emergency Medicine

## 2017-05-04 ENCOUNTER — Inpatient Hospital Stay
Admission: EM | Admit: 2017-05-04 | Discharge: 2017-05-06 | DRG: 312 | Disposition: A | Discharge status: Home / Self Care | Payer: PPO | Attending: Internal Medicine | Admitting: Internal Medicine

## 2017-05-04 DIAGNOSIS — M546 Pain in thoracic spine: Secondary | ICD-10-CM | POA: Diagnosis present

## 2017-05-04 DIAGNOSIS — Z888 Allergy status to other drugs, medicaments and biological substances status: Secondary | ICD-10-CM

## 2017-05-04 DIAGNOSIS — K219 Gastro-esophageal reflux disease without esophagitis: Secondary | ICD-10-CM | POA: Diagnosis not present

## 2017-05-04 DIAGNOSIS — Z87891 Personal history of nicotine dependence: Secondary | ICD-10-CM

## 2017-05-04 DIAGNOSIS — I248 Other forms of acute ischemic heart disease: Secondary | ICD-10-CM | POA: Diagnosis not present

## 2017-05-04 DIAGNOSIS — R7989 Other specified abnormal findings of blood chemistry: Secondary | ICD-10-CM

## 2017-05-04 DIAGNOSIS — R55 Syncope and collapse: Secondary | ICD-10-CM | POA: Diagnosis not present

## 2017-05-04 DIAGNOSIS — I951 Orthostatic hypotension: Secondary | ICD-10-CM | POA: Diagnosis not present

## 2017-05-04 DIAGNOSIS — R531 Weakness: Secondary | ICD-10-CM | POA: Diagnosis not present

## 2017-05-04 DIAGNOSIS — H409 Unspecified glaucoma: Secondary | ICD-10-CM | POA: Diagnosis not present

## 2017-05-04 DIAGNOSIS — R778 Other specified abnormalities of plasma proteins: Secondary | ICD-10-CM

## 2017-05-04 DIAGNOSIS — I10 Essential (primary) hypertension: Secondary | ICD-10-CM | POA: Diagnosis not present

## 2017-05-04 DIAGNOSIS — Z79899 Other long term (current) drug therapy: Secondary | ICD-10-CM

## 2017-05-04 DIAGNOSIS — E871 Hypo-osmolality and hyponatremia: Secondary | ICD-10-CM | POA: Diagnosis present

## 2017-05-04 DIAGNOSIS — R631 Polydipsia: Secondary | ICD-10-CM | POA: Diagnosis present

## 2017-05-04 DIAGNOSIS — R748 Abnormal levels of other serum enzymes: Secondary | ICD-10-CM | POA: Diagnosis not present

## 2017-05-04 LAB — CBC WITH DIFFERENTIAL/PLATELET
BASOS ABS: 0 10*3/uL (ref 0–0.1)
Basophils Relative: 0 %
Eosinophils Absolute: 0 10*3/uL (ref 0–0.7)
Eosinophils Relative: 0 %
HEMATOCRIT: 37.9 % (ref 35.0–47.0)
Hemoglobin: 13.1 g/dL (ref 12.0–16.0)
LYMPHS PCT: 9 %
Lymphs Abs: 0.6 10*3/uL — ABNORMAL LOW (ref 1.0–3.6)
MCH: 31.7 pg (ref 26.0–34.0)
MCHC: 34.5 g/dL (ref 32.0–36.0)
MCV: 91.9 fL (ref 80.0–100.0)
Monocytes Absolute: 1.1 10*3/uL — ABNORMAL HIGH (ref 0.2–0.9)
Monocytes Relative: 16 %
NEUTROS ABS: 5.4 10*3/uL (ref 1.4–6.5)
Neutrophils Relative %: 75 %
Platelets: 283 10*3/uL (ref 150–440)
RBC: 4.13 MIL/uL (ref 3.80–5.20)
RDW: 12.8 % (ref 11.5–14.5)
WBC: 7.2 10*3/uL (ref 3.6–11.0)

## 2017-05-04 LAB — COMPREHENSIVE METABOLIC PANEL
ALT: 18 U/L (ref 14–54)
AST: 31 U/L (ref 15–41)
Albumin: 3.3 g/dL — ABNORMAL LOW (ref 3.5–5.0)
Alkaline Phosphatase: 101 U/L (ref 38–126)
Anion gap: 8 (ref 5–15)
BILIRUBIN TOTAL: 0.7 mg/dL (ref 0.3–1.2)
BUN: 12 mg/dL (ref 6–20)
CHLORIDE: 90 mmol/L — AB (ref 101–111)
CO2: 26 mmol/L (ref 22–32)
CREATININE: 0.91 mg/dL (ref 0.44–1.00)
Calcium: 8.5 mg/dL — ABNORMAL LOW (ref 8.9–10.3)
GFR calc Af Amer: >60 mL/min (ref >60)
GFR, EST NON AFRICAN AMERICAN: 56 mL/min — AB (ref >60)
Glucose, Bld: 152 mg/dL — ABNORMAL HIGH (ref 65–99)
Potassium: 3.7 mmol/L (ref 3.5–5.1)
Sodium: 124 mmol/L — ABNORMAL LOW (ref 135–145)
TOTAL PROTEIN: 7 g/dL (ref 6.5–8.1)

## 2017-05-04 LAB — URINALYSIS, COMPLETE (UACMP) WITH MICROSCOPIC
Bilirubin Urine: NEGATIVE
GLUCOSE, UA: NEGATIVE mg/dL
Hgb urine dipstick: NEGATIVE
Ketones, ur: NEGATIVE mg/dL
Nitrite: NEGATIVE
PROTEIN: NEGATIVE mg/dL
SQUAMOUS EPITHELIAL / LPF: NONE SEEN
Specific Gravity, Urine: 1.003 — ABNORMAL LOW (ref 1.005–1.030)
pH: 6 (ref 5.0–8.0)

## 2017-05-04 LAB — TROPONIN I: Troponin I: 0.06 ng/mL (ref <0.03)

## 2017-05-04 MED ORDER — ASPIRIN 81 MG PO CHEW
324.0000 mg | CHEWABLE_TABLET | Freq: Once | ORAL | Status: AC
  Administered 2017-05-04: 324 mg via ORAL
  Filled 2017-05-04: qty 4

## 2017-05-04 MED ORDER — SODIUM CHLORIDE 0.9 % IV SOLN
Freq: Once | INTRAVENOUS | Status: AC
  Administered 2017-05-04: 22:00:00 via INTRAVENOUS

## 2017-05-04 NOTE — ED Triage Notes (Signed)
Pt presents to ED from home via [facility] c/o generalized weakness x3-4 days and syncopal episode today while sitting in chair per family. Pt denies head involvement, denies use of blood thinners. Pt reports symptoms started when PCP put her on lisinopril-HCTZ. Denies pain at this time but states shoulder blades were "tight" this morning. Denies n/v/d. CBG 167 per EMS.

## 2017-05-04 NOTE — ED Provider Notes (Signed)
[facility] Emergency Department Provider Note       Time seen: ----------------------------------------- 8:37 PM on 05/04/2017 -----------------------------------------     I have reviewed the triage vital signs and the nursing notes.   HISTORY   Chief Complaint Weakness    HPI Robin Fernandez is a 83 y.o. female who presents to the ED for weakness. She has had generalized weakness for 3-4 days and a possible syncopal event today was sitting in a chair with her family. She reports symptoms started when her primary doctor put her on lisinopril/HCTZ. She denies any pain but states her shoulder blades were tight this morning. She denies nausea, vomiting or diarrhea.   Past Medical History:  Diagnosis Date  . GERD (gastroesophageal reflux disease)   . Glaucoma   . Vasovagal syncope     There are no active problems to display for this patient.   Past Surgical History:  Procedure Laterality Date  . ABDOMINAL HYSTERECTOMY    . EYE SURGERY      Allergies Amoxil [amoxicillin]; Azopt [brinzolamide]; Deltasone [prednisone]; Erythromycin; Lumigan [bimatoprost]; Parafon forte dsc [chlorzoxazone]; and Prevacid [lansoprazole]  Social History Social History  Substance Use Topics  . Smoking status: Former Research scientist (life sciences)  . Smokeless tobacco: Not on file  . Alcohol use No    Review of Systems Constitutional: Negative for fever. Eyes: Negative for vision changes ENT:  Negative for congestion, sore throat Cardiovascular: Negative for chest pain. Respiratory: Negative for shortness of breath. Gastrointestinal: Negative for abdominal pain, vomiting and diarrhea. Genitourinary: Negative for dysuria. Musculoskeletal: Negative for back pain. Skin: Negative for rash. Neurological: Negative for headaches,Positive for generalized weakness  All systems negative/normal/unremarkable except as stated in the  HPI  ____________________________________________   PHYSICAL EXAM:  VITAL SIGNS: ED Triage Vitals  Enc Vitals Group     BP      Pulse      Resp      Temp      Temp src      SpO2      Weight      Height      Head Circumference      Peak Flow      Pain Score      Pain Loc      Pain Edu?      Excl. in [place]?    Constitutional: Alert and oriented. Well appearing and in no distress. Eyes: Conjunctivae are normal. Normal extraocular movements. ENT   Head: Normocephalic and atraumatic.   Nose: No congestion/rhinnorhea.   Mouth/Throat: Mucous membranes are moist.   Neck: No stridor. Cardiovascular: Normal rate, regular rhythm. No murmurs, rubs, or gallops. Respiratory: Normal respiratory effort without tachypnea nor retractions. Breath sounds are clear and equal bilaterally. No wheezes/rales/rhonchi. Gastrointestinal: Soft and nontender. Normal bowel sounds Musculoskeletal: Nontender with normal range of motion in extremities. No lower extremity tenderness nor edema. Neurologic:  Normal speech and language. No gross focal neurologic deficits are appreciated.  Skin:  Skin is warm, dry and intact. No rash noted. Psychiatric: Mood and affect are normal. Speech and behavior are normal.  ____________________________________________  EKG: Interpreted by me. Sinus rhythm rate 85 bpm, normal PR interval, normal QRS, normal QT.  ____________________________________________  ED COURSE:  Pertinent labs & imaging results that were available during my care of the patient were reviewed by me and considered in my medical decision making (see chart for details). Patient presents for weakness, we will assess with labs and imaging as indicated.   Procedures  ____________________________________________   LABS (pertinent positives/negatives)  Labs Reviewed  CBC WITH DIFFERENTIAL/PLATELET - Abnormal; Notable for the following:       Result Value   Lymphs Abs 0.6 (*)     Monocytes Absolute 1.1 (*)    All other components within normal limits  COMPREHENSIVE METABOLIC PANEL - Abnormal; Notable for the following:    Sodium 124 (*)    Chloride 90 (*)    Glucose, Bld 152 (*)    Calcium 8.5 (*)    Albumin 3.3 (*)    GFR calc non Af Amer 56 (*)    All other components within normal limits  TROPONIN I - Abnormal; Notable for the following:    Troponin I 0.06 (*)    All other components within normal limits  URINALYSIS, COMPLETE (U[facility]MP) WITH MICROSCOPIC   ____________________________________________  FINAL ASSESSMENT AND PLAN  Weakness, Hyponatremia, elevated troponin  Plan: Patient's labs and imaging were dictated above. Patient had presented for weakness and possible syncopal event. Here she was found to have a low sodium which is likely from excessive free water intake. Unclear etiology for the elevated troponin. She was given aspirin and we have started her on saline for low sodium. I will discuss with the hospitalist for admission.   Earleen Newport, MD   Note: This note was generated in part or whole with voice recognition software. Voice recognition is usually quite accurate but there are transcription errors that can and very often do occur. I apologize for any typographical errors that were not detected and corrected.     Earleen Newport, MD 05/04/17 2124

## 2017-05-04 NOTE — H&P (Signed)
History and Physical   [facility] - [place] @ [facility] Admission History and Physical [employer], D.O.   Patient Name: Robin Fernandez MR#: [id number] Date of Birth: 1929/09/03 Date of Admission: 05/04/2017  Referring MD/NP/PA: Dr. Jimmye Norman Primary Care Physician: Rusty Aus, MD Patient coming from: Home  Chief Complaint:  Chief Complaint  Patient presents with  . Weakness    HPI: Robin Fernandez is a 83 y.o. female with a known history of Hypertension, GERD, vasovagal syncope presents to the emergency department for evaluation of muscle consciousness.  Patient was in a usual state of health until 3-4 days ago patient complained of gradual onset of generalized weakness, dizziness, gait instability. Today she describes the sudden onset of dizziness followed by loss of consciousness following her dinner meal. She denied any chest pain, shortness of breath, diaphoresis, palpitations. Of note she has been complaining of upper back pain located between the scapula for the last 3-4 days, described as tightness.  Patient denies fevers/chills, chest pain, shortness of breath, N/V/C/D, abdominal pain, dysuria/frequency, changes in mental status.    Patient notes that her primary care provider added lisinopril to her hydrochlorothiazide 4 days ago. Patient's daughter also indicates the patient has been taking herbal supplements for anxiety after self discontinuing her SSRIs. Patient also states that she drinks at least 2 L of water per day.   Patient is functionally independent, lives at home with her husband and has home health aides. Otherwise there has been no change in status. Patient has been taking medication as prescribed and there has been no recent change in diet.  No recent antibiotics.  There has been no recent illness, hospitalizations, travel or sick contacts.    EMS/ED Course: Patient received normal saline, aspirin 324mg .  Review of Systems:  CONSTITUTIONAL: Positive  weakness, dizziness, gait instability No fever/chills, fatigue, weight gain/loss, headache. EYES: No blurry or double vision. ENT: No tinnitus, postnasal drip, redness or soreness of the oropharynx. RESPIRATORY: No cough, dyspnea, wheeze.  No hemoptysis.  CARDIOVASCULAR: No chest pain, palpitations, syncope, orthopnea. No lower extremity edema.  GASTROINTESTINAL: No nausea, vomiting, abdominal pain, diarrhea, constipation.  No hematemesis, melena or hematochezia. GENITOURINARY: No dysuria, frequency, hematuria. ENDOCRINE: No polyuria or nocturia. No heat or cold intolerance. HEMATOLOGY: No anemia, bruising, bleeding. INTEGUMENTARY: No rashes, ulcers, lesions. MUSCULOSKELETAL: No arthritis, gout, dyspnea. Positive upper back pain NEUROLOGIC: No numbness, tingling, ataxia, seizure-type activity, weakness. PSYCHIATRIC: No anxiety, depression, insomnia.   Past Medical History:  Diagnosis Date  . GERD (gastroesophageal reflux disease)   . Glaucoma   . Vasovagal syncope     Past Surgical History:  Procedure Laterality Date  . ABDOMINAL HYSTERECTOMY    . EYE SURGERY       reports that she has quit smoking. Her smoking use included Cigarettes. She has never used smokeless tobacco. She reports that she does not drink alcohol or use drugs.  Allergies  Allergen Reactions  . Salicylates Hives    Aspirin-like analgesic, salicylates group--urticaria  . Alendronate Other (See Comments)  . Amoxil [Amoxicillin] Other (See Comments)    Diarrhea, Blood in Stool  . Azopt [Brinzolamide] Other (See Comments)    Tingling in arms and feet  . Deltasone [Prednisone]     Affects blood pressure  . Erythromycin Other (See Comments)    Diarrhea, Blood in Stool   . Lumigan [Bimatoprost]     Nervousness  . Meloxicam Other (See Comments)  . Ofloxacin Other (See Comments)  . Parafon [place] Dsc [Chlorzoxazone]  Other (See Comments)    "made very sick"  . Prevacid [Lansoprazole] Swelling    Family  History   Medical History Relation Name Comments  Cirrhosis Brother    Myocardial Infarction (Heart attack) Father    Pacemaker Mother       Prior to Admission medications   Medication Sig Start Date End Date Taking? Authorizing Provider  FLUoxetine (PROZAC) 20 MG capsule Take 1 capsule by mouth daily. 04/13/17  Yes [provider]  lisinopril-hydrochlorothiazide (PRINZIDE,ZESTORETIC) 10-12.5 MG tablet Take 1 tablet by mouth daily. 04/26/17  Yes [provider]  ranitidine (ZANTAC) 150 MG tablet Take 1 tablet by mouth 2 (two) times daily. 04/21/17  Yes [provider]  sertraline (ZOLOFT) 25 MG tablet Take 1 tablet by mouth daily. 04/10/17  Yes [provider]  vitamin B-12 (CYANOCOBALAMIN) 1000 MCG tablet Take 1 tablet by mouth daily.   Yes [provider]  Travoprost, BAK Free, (TRAVATAN) 0.004 % SOLN ophthalmic solution Place 1 drop into the left eye at bedtime.    [provider]    Physical Exam: Vitals:   05/04/17 2038 05/04/17 2042  BP:  (!) 148/66  Pulse:  81  Resp:  18  Temp:  98.6 F (37 C)  TempSrc:  Oral  SpO2:  97%  Weight: 53 kg (116 lb 14.4 oz)   Height: 5' (1.524 m)     GENERAL: 83 y.o.-year-old white female patient, well-developed, well-nourished lying in the bed in no acute distress.  Pleasant and cooperative.   HEENT: Head atraumatic, normocephalic. Pupils equal, round, reactive to light and accommodation. No scleral icterus. Extraocular muscles intact. Nares are patent. Oropharynx is clear. Mucus membranes moist. NECK: Supple, full range of motion. No JVD, no bruit heard. No thyroid enlargement, no tenderness, no cervical lymphadenopathy. CHEST: Normal breath sounds bilaterally. No wheezing, rales, rhonchi or crackles. No use of accessory muscles of respiration.  No reproducible chest wall tenderness.  CARDIOVASCULAR: S1, S2 normal. No murmurs, rubs, or gallops. Cap refill <2 seconds. Pulses intact  distally.  ABDOMEN: Soft, nondistended, nontender. No rebound, guarding, rigidity. Normoactive bowel sounds present in all four quadrants. No organomegaly or mass. EXTREMITIES: No pedal edema, cyanosis, or clubbing. No calf tenderness or Homan's sign.  NEUROLOGIC: The patient is alert and oriented x 3. Cranial nerves II through XII are grossly intact with no focal sensorimotor deficit. Muscle strength 5/5 in all extremities. Sensation intact. Gait not checked. PSYCHIATRIC:  Normal affect, mood, thought content. SKIN: Warm, dry, and intact without obvious rash, lesion, or ulcer.    Labs on Admission:  CBC:  Recent Labs Lab 05/04/17 2042  WBC 7.2  NEUTROABS 5.4  HGB 13.1  HCT 37.9  MCV 91.9  PLT 433   Basic Metabolic Panel:  Recent Labs Lab 05/04/17 2042  NA 124*  K 3.7  CL 90*  CO2 26  GLUCOSE 152*  BUN 12  CREATININE 0.91  CALCIUM 8.5*   GFR: Estimated Creatinine Clearance: 31.9 mL/min (by C-G formula based on SCr of 0.91 mg/dL). Liver Function Tests:  Recent Labs Lab 05/04/17 2042  AST 31  ALT 18  ALKPHOS 101  BILITOT 0.7  PROT 7.0  ALBUMIN 3.3*   No results for input(s): LIPASE, AMYLASE in the last 168 hours. No results for input(s): AMMONIA in the last 168 hours. Coagulation Profile: No results for input(s): INR, PROTIME in the last 168 hours. Cardiac Enzymes:  Recent Labs Lab 05/04/17 2042  TROPONINI 0.06*   BNP (last 3  results) No results for input(s): PROBNP in the last 8760 hours. HbA1C: No results for input(s): HGBA1C in the last 72 hours. CBG: No results for input(s): GLUCAP in the last 168 hours. Lipid Profile: No results for input(s): CHOL, HDL, LDLCALC, TRIG, CHOLHDL, LDLDIRECT in the last 72 hours. Thyroid Function Tests: No results for input(s): TSH, T4TOTAL, FREET4, T3FREE, THYROIDAB in the last 72 hours. Anemia Panel: No results for input(s): VITAMINB12, FOLATE, FERRITIN, TIBC, IRON, RETICCTPCT in the last 72 hours. Urine  analysis:    Component Value Date/Time   COLORURINE STRAW (A) 05/04/2017 2042   APPEARANCEUR CLEAR (A) 05/04/2017 2042   APPEARANCEUR Hazy 07/06/2014 1426   LABSPEC 1.003 (L) 05/04/2017 2042   LABSPEC 1.017 07/06/2014 1426   PHURINE 6.0 05/04/2017 2042   GLUCOSEU NEGATIVE 05/04/2017 2042   GLUCOSEU Negative 07/06/2014 1426   HGBUR NEGATIVE 05/04/2017 2042   BILIRUBINUR NEGATIVE 05/04/2017 2042   BILIRUBINUR Negative 07/06/2014 [place] 05/04/2017 2042   PROTEINUR NEGATIVE 05/04/2017 2042   NITRITE NEGATIVE 05/04/2017 2042   LEUKOCYTESUR TRACE (A) 05/04/2017 2042   LEUKOCYTESUR 3+ 07/06/2014 1426   Sepsis Labs: @LABRCNTIP (procalcitonin:4,lacticidven:4) )No results found for this or any previous visit (from the past 240 hour(s)).   Radiological Exams on Admission: No results found.  EKG: Normal sinus rhythm at 85 bpm with normal axis and nonspecific ST-T wave changes.   Assessment/Plan  This is a 83 y.o. female with a history of Hypertension, GERD, vasovagal syncope now being admitted with:  #. Syncope, unclear etiology - Admit inpatient with telemetry monitoring - IV fluid hydration - Check orthostatics - Check echo. Carotids done in November 17 with mild plaque.  #. Elevated troponin with atypical chest/upper back pain - Trend troponins, check lipids and TSH. - Aspirin ordered.   - Check echo - Cardiology consultation has been requested of Dr. Ubaldo Glassing.   #. Hyponatremia, likely medication and polydipsia - Gentle IVFs - Repeat BMP - Hold lisinopril, HCTZ  #. History of hypertension - Monitor closely.   #. History of GERD - Continue Prilosec  Admission status: Inpatient, tele IV Fluids: NS Diet/Nutrition: Heart healthy Consults called: Cardiology  DVT Px: Lovenox, SCDs and early ambulation. Code Status: Full Code  Disposition Plan: To home in 1-2 days  All the records are reviewed and case discussed with ED provider. Management plans  discussed with the patient and/or family who express understanding and agree with plan of care.  Yaakov Saindon D.O. on 05/04/2017 at 11:08 PM Between 7am to 6pm - Pager - [phone] After 6pm go to [email or web address] - [employer] [facility] [place] [facility] Office [phone] CC: Primary care physician; Rusty Aus, MD   05/04/2017, 11:08 PM

## 2017-05-05 ENCOUNTER — Inpatient Hospital Stay
Admit: 2017-05-05 | Discharge: 2017-05-05 | Disposition: A | Discharge status: Home / Self Care | Payer: PPO | Attending: Family Medicine | Admitting: Family Medicine

## 2017-05-05 LAB — BASIC METABOLIC PANEL
ANION GAP: 6 (ref 5–15)
BUN: 7 mg/dL (ref 6–20)
CALCIUM: 8.2 mg/dL — AB (ref 8.9–10.3)
CO2: 26 mmol/L (ref 22–32)
Chloride: 100 mmol/L — ABNORMAL LOW (ref 101–111)
Creatinine, Ser: 0.4 mg/dL — ABNORMAL LOW (ref 0.44–1.00)
GFR calc Af Amer: >60 mL/min (ref >60)
GFR calc non Af Amer: >60 mL/min (ref >60)
GLUCOSE: 92 mg/dL (ref 65–99)
Potassium: 3.5 mmol/L (ref 3.5–5.1)
Sodium: 132 mmol/L — ABNORMAL LOW (ref 135–145)

## 2017-05-05 LAB — LIPID PANEL
CHOL/HDL RATIO: 3.5 ratio
CHOLESTEROL: 139 mg/dL (ref 0–200)
HDL: 40 mg/dL — AB (ref >40)
LDL Cholesterol: 92 mg/dL (ref 0–99)
TRIGLYCERIDES: 36 mg/dL (ref <150)
VLDL: 7 mg/dL (ref 0–40)

## 2017-05-05 LAB — CBC
HEMATOCRIT: 35.7 % (ref 35.0–47.0)
HEMOGLOBIN: 12.4 g/dL (ref 12.0–16.0)
MCH: 31.8 pg (ref 26.0–34.0)
MCHC: 34.7 g/dL (ref 32.0–36.0)
MCV: 91.5 fL (ref 80.0–100.0)
Platelets: 261 10*3/uL (ref 150–440)
RBC: 3.9 MIL/uL (ref 3.80–5.20)
RDW: 12.9 % (ref 11.5–14.5)
WBC: 4.5 10*3/uL (ref 3.6–11.0)

## 2017-05-05 LAB — TSH: TSH: 2.839 u[IU]/mL (ref 0.350–4.500)

## 2017-05-05 LAB — ECHOCARDIOGRAM COMPLETE
Height: 60 in
Weight: 1870.4 oz

## 2017-05-05 LAB — PHOSPHORUS: Phosphorus: 3 mg/dL (ref 2.5–4.6)

## 2017-05-05 LAB — TROPONIN I
TROPONIN I: 0.06 ng/mL — AB (ref <0.03)
Troponin I: 0.09 ng/mL (ref <0.03)
Troponin I: 0.05 ng/mL (ref <0.03)

## 2017-05-05 LAB — MAGNESIUM: Magnesium: 1.9 mg/dL (ref 1.7–2.4)

## 2017-05-05 MED ORDER — TRAVOPROST (BAK FREE) 0.004 % OP SOLN
1.0000 [drp] | Freq: Every day | OPHTHALMIC | Status: DC
  Administered 2017-05-05: 1 [drp] via OPHTHALMIC
  Filled 2017-05-05: qty 2.5

## 2017-05-05 MED ORDER — SODIUM CHLORIDE 0.9 % IV SOLN
INTRAVENOUS | Status: DC
  Administered 2017-05-05: 01:00:00 via INTRAVENOUS

## 2017-05-05 MED ORDER — ASPIRIN EC 81 MG PO TBEC
81.0000 mg | DELAYED_RELEASE_TABLET | Freq: Every day | ORAL | Status: DC
  Administered 2017-05-05 – 2017-05-06 (×2): 81 mg via ORAL
  Filled 2017-05-05 (×2): qty 1

## 2017-05-05 MED ORDER — LATANOPROST 0.005 % OP SOLN
1.0000 [drp] | Freq: Every day | OPHTHALMIC | Status: DC
  Filled 2017-05-05: qty 2.5

## 2017-05-05 MED ORDER — ENOXAPARIN SODIUM 30 MG/0.3ML ~~LOC~~ SOLN
30.0000 mg | SUBCUTANEOUS | Status: DC
  Administered 2017-05-05: 30 mg via SUBCUTANEOUS
  Filled 2017-05-05: qty 0.3

## 2017-05-05 MED ORDER — SENNOSIDES-DOCUSATE SODIUM 8.6-50 MG PO TABS: 1.0000 | ORAL_TABLET | Freq: Every evening | ORAL | Status: DC | PRN

## 2017-05-05 MED ORDER — LISINOPRIL 10 MG PO TABS
10.0000 mg | ORAL_TABLET | Freq: Every day | ORAL | Status: DC
  Filled 2017-05-05: qty 1

## 2017-05-05 MED ORDER — VITAMIN B-12 1000 MCG PO TABS
1000.0000 ug | ORAL_TABLET | Freq: Every day | ORAL | Status: DC
  Administered 2017-05-05 – 2017-05-06 (×2): 1000 ug via ORAL
  Filled 2017-05-05 (×2): qty 1

## 2017-05-05 MED ORDER — OXYCODONE HCL 5 MG PO TABS: 5.0000 mg | ORAL_TABLET | ORAL | Status: DC | PRN

## 2017-05-05 MED ORDER — BISACODYL 5 MG PO TBEC: 5.0000 mg | DELAYED_RELEASE_TABLET | Freq: Every day | ORAL | Status: DC | PRN

## 2017-05-05 MED ORDER — ENOXAPARIN SODIUM 40 MG/0.4ML ~~LOC~~ SOLN: 40.0000 mg | SUBCUTANEOUS | Status: DC

## 2017-05-05 MED ORDER — MAGNESIUM CITRATE PO SOLN
1.0000 | Freq: Once | ORAL | Status: DC | PRN
  Filled 2017-05-05: qty 296

## 2017-05-05 MED ORDER — ONDANSETRON HCL 4 MG PO TABS: 4.0000 mg | ORAL_TABLET | Freq: Four times a day (QID) | ORAL | Status: DC | PRN

## 2017-05-05 MED ORDER — IPRATROPIUM BROMIDE 0.02 % IN SOLN: 0.5000 mg | Freq: Four times a day (QID) | RESPIRATORY_TRACT | Status: DC | PRN

## 2017-05-05 MED ORDER — SERTRALINE HCL 50 MG PO TABS
25.0000 mg | ORAL_TABLET | Freq: Every day | ORAL | Status: DC
  Filled 2017-05-05 (×2): qty 1

## 2017-05-05 MED ORDER — ACETAMINOPHEN 650 MG RE SUPP: 650.0000 mg | Freq: Four times a day (QID) | RECTAL | Status: DC | PRN

## 2017-05-05 MED ORDER — ALBUTEROL SULFATE (2.5 MG/3ML) 0.083% IN NEBU: 2.5000 mg | INHALATION_SOLUTION | Freq: Four times a day (QID) | RESPIRATORY_TRACT | Status: DC | PRN

## 2017-05-05 MED ORDER — FAMOTIDINE 20 MG PO TABS
20.0000 mg | ORAL_TABLET | Freq: Every day | ORAL | Status: DC
  Administered 2017-05-05 – 2017-05-06 (×2): 20 mg via ORAL
  Filled 2017-05-05 (×2): qty 1

## 2017-05-05 MED ORDER — ACETAMINOPHEN 325 MG PO TABS: 650.0000 mg | ORAL_TABLET | Freq: Four times a day (QID) | ORAL | Status: DC | PRN

## 2017-05-05 MED ORDER — ONDANSETRON HCL 4 MG/2ML IJ SOLN: 4.0000 mg | Freq: Four times a day (QID) | INTRAMUSCULAR | Status: DC | PRN

## 2017-05-05 NOTE — Care Management (Addendum)
Admitted due to episode of syncope and mildly elevated troponins. Cardiology consult pending.  Independent in all adls, no issues accessing medical care, obtaining medications or with transportation.  Current with her PCP.  Has in home caregiver support for her husband who is bedridden.

## 2017-05-05 NOTE — Progress Notes (Signed)
[facility] [place] NAME: Robin Fernandez    MR#:  [id number]  DATE OF BIRTH:  08-Feb-1929  SUBJECTIVE:   Patient here due to generalized weakness and noted to be hyponatremic and also noted to have a syncopal episode. Patient is positive for orthostatic hypotension. Still feels somewhat weak today.  REVIEW OF SYSTEMS:    Review of Systems  Constitutional: Negative for chills and fever.  HENT: Negative for congestion and tinnitus.   Eyes: Negative for blurred vision and double vision.  Respiratory: Negative for cough, shortness of breath and wheezing.   Cardiovascular: Negative for chest pain, orthopnea and PND.  Gastrointestinal: Negative for abdominal pain, diarrhea, nausea and vomiting.  Genitourinary: Negative for dysuria and hematuria.  Neurological: Positive for weakness. Negative for dizziness, sensory change and focal weakness.  All other systems reviewed and are negative.   Nutrition: Regular Tolerating Diet: Yes Tolerating PT: Await Eval.     DRUG ALLERGIES:   Allergies  Allergen Reactions  . Salicylates Hives    Aspirin-like analgesic, salicylates group--urticaria  . Alendronate Other (See Comments)  . Amoxil [Amoxicillin] Other (See Comments)    Diarrhea, Blood in Stool  . Azopt [Brinzolamide] Other (See Comments)    Tingling in arms and feet  . Deltasone [Prednisone]     Affects blood pressure  . Erythromycin Other (See Comments)    Diarrhea, Blood in Stool   . Lumigan [Bimatoprost]     Nervousness  . Meloxicam Other (See Comments)  . Ofloxacin Other (See Comments)  . Parafon Forte Dsc [Chlorzoxazone] Other (See Comments)    "made very sick"  . Prevacid [Lansoprazole] Swelling    VITALS:  Blood pressure (!) 144/53, pulse 69, temperature 98.4 F (36.9 C), temperature source Oral, resp. rate 18, height 5' (1.524 m), weight 53 kg (116 lb 14.4 oz), SpO2 96 %.  PHYSICAL EXAMINATION:   Physical Exam  GENERAL:   83 y.o.-year-old thin patient lying in bed in no acute distress.  EYES: Pupils equal, round, reactive to light and accommodation. No scleral icterus. Extraocular muscles intact.  HEENT: Head atraumatic, normocephalic. Oropharynx and nasopharynx clear.  NECK:  Supple, no jugular venous distention. No thyroid enlargement, no tenderness.  LUNGS: Normal breath sounds bilaterally, no wheezing, rales, rhonchi. No use of accessory muscles of respiration.  CARDIOVASCULAR: S1, S2 normal. No murmurs, rubs, or gallops.  ABDOMEN: Soft, nontender, nondistended. Bowel sounds present. No organomegaly or mass.  EXTREMITIES: No cyanosis, clubbing or edema b/l.    NEUROLOGIC: Cranial nerves II through XII are intact. No focal Motor or sensory deficits b/l.   PSYCHIATRIC: The patient is alert and oriented x 3.  SKIN: No obvious rash, lesion, or ulcer.    LABORATORY PANEL:   CBC  Recent Labs Lab 05/05/17 0255  WBC 4.5  HGB 12.4  HCT 35.7  PLT 261   ------------------------------------------------------------------------------------------------------------------  Chemistries   Recent Labs Lab 05/04/17 2042 05/05/17 0255  NA 124* 132*  K 3.7 3.5  CL 90* 100*  CO2 26 26  GLUCOSE 152* 92  BUN 12 7  CREATININE 0.91 0.40*  CALCIUM 8.5* 8.2*  MG  --  1.9  AST 31  --   ALT 18  --   ALKPHOS 101  --   BILITOT 0.7  --    ------------------------------------------------------------------------------------------------------------------  Cardiac Enzymes  Recent Labs Lab 05/05/17 0843  TROPONINI 0.06*   ------------------------------------------------------------------------------------------------------------------  RADIOLOGY:  No results found.   ASSESSMENT AND PLAN:  83 year old female w/ hx of Glaucoma, GERD, previous hx of vasovagal syncope who presented to the hospital due to syncopal episode.  1. Syncope-etiology unclear possible orthostasis. Patient's orthostatic vital signs  are positive. -No alarms on telemetry, echocardiogram  pending, carotid duplex from November of last year showed no hemodynamically significant stenosis.  2. Elevated troponin-secondary to supply demand ischemia. Troponins did not trend upwards. -Appreciate cardiology input. Await echocardiogram results.  3. Hyponatremia-hypovolemic related to use of diuretics and also polydipsia. -Improved with IV fluids. Hold HCTZ lisinopril for now.  4. HTN - will resume Lisinopril.  Hold HCTZ given mild Hyponatremia and orthostasis.   5. Glaucoma - cont. Xalatan eye drops.   Await PT eval.   All the records are reviewed and case discussed with Care Management/Social Worker. Management plans discussed with the patient, family and they are in agreement.  CODE STATUS: Full code  DVT Prophylaxis: Lovenox  TOTAL TIME TAKING CARE OF THIS PATIENT: 30 minutes.   POSSIBLE D/C IN 1-2 DAYS, DEPENDING ON CLINICAL CONDITION.   [name] M.D on 05/05/2017 at 1:47 PM  Between 7am to 6pm - Pager - [phone]  After 6pm go to [email or web address] - Proofreader  [facility]  Office  [phone]  CC: Primary care physician; Rusty Aus, MD

## 2017-05-05 NOTE — Consult Note (Signed)
[facility] Cardiology  CARDIOLOGY CONSULT NOTE  Patient ID: Robin Fernandez MRN: [id number] DOB/AGE: Oct 31, 1929 83 y.o.  Admit date: 05/04/2017 Referring Physician Hugelmeyer Primary Physician Emily Filbert Primary Cardiologist Fath Reason for Consultation Syncope  HPI: 83 year old female referred for syncope. The patient has a history of recurrent vasovagal syncope, hypertension, and GERD. Patient reports a 3-4 day history of generalized weakness and dizziness. The patient states lisinopril was added to her hydrochlorothiazide about 4 days ago due to elevated blood pressure. The patient states that she was eating dinner last night when she developed increasing weakness and lost consciousness, witnessed by her husband and his aide, and she was brought to [facility] ER. The patient states this episode was no different than prior episodes of syncope, the last of which occurred 6 weeks ago. She denies experiencing palpitations or heart racing. She denies chest pain or shortness of breath. She has had tightness across her scapular region for the last 3 or 4 days that has since resolved. Admission labs notable for borderline elevated troponin of 0.06, followed by 0.09, and sodium 132. Carotid ultrasound 10/27/16 revealed mild bilateral plaque. 2-D echocardiogram in 2014 revealed normal left ventricular function with mild mitral and tricuspid regurgitation. Currently, the patient reports feeling well, denying chest pain, shortness of breath, palpitations, or generalized weakness. Most recent blood pressure is 123/45 off of BP medications.  Review of systems complete and found to be negative unless listed above     Past Medical History:  Diagnosis Date  . GERD (gastroesophageal reflux disease)   . Glaucoma   . Vasovagal syncope     Past Surgical History:  Procedure Laterality Date  . ABDOMINAL HYSTERECTOMY    . EYE SURGERY      Prescriptions Prior to Admission  Medication Sig Dispense Refill Last Dose  .  FLUoxetine (PROZAC) 20 MG capsule Take 1 capsule by mouth daily.     Marland Kitchen lisinopril-hydrochlorothiazide (PRINZIDE,ZESTORETIC) 10-12.5 MG tablet Take 1 tablet by mouth daily.     . ranitidine (ZANTAC) 150 MG tablet Take 1 tablet by mouth 2 (two) times daily.     . sertraline (ZOLOFT) 25 MG tablet Take 1 tablet by mouth daily.     . vitamin B-12 (CYANOCOBALAMIN) 1000 MCG tablet Take 1 tablet by mouth daily.     . Travoprost, BAK Free, (TRAVATAN) 0.004 % SOLN ophthalmic solution Place 1 drop into the left eye at bedtime.   Completed Course at Unknown time   Social History   Social History  . Marital status: Married    Spouse name: N/A  . Number of children: N/A  . Years of education: N/A   Occupational History  . Not on file.   Social History Main Topics  . Smoking status: Former Smoker    Types: Cigarettes  . Smokeless tobacco: Never Used  . Alcohol use No  . Drug use: No  . Sexual activity: Not on file   Other Topics Concern  . Not on file   Social History Narrative  . No narrative on file    History reviewed. No pertinent family history.    Review of systems complete and found to be negative unless listed above      PHYSICAL EXAM  General: Well developed, well nourished, in no acute distress HEENT:  Normocephalic and atramatic Neck:  No JVD.  Lungs: Normal effort of breathing.  Heart: HRRR . Normal S1 and S2 without gallops or murmurs.  Abdomen: Bowel sounds are positive, abdomen soft and non-tender  Msk:  Back normal, sitting upright in bed Extremities: No clubbing, cyanosis or edema.   Neuro: Alert and oriented X 3. Psych:  Good affect, responds appropriately  Labs:   Lab Results  Component Value Date   WBC 4.5 05/05/2017   HGB 12.4 05/05/2017   HCT 35.7 05/05/2017   MCV 91.5 05/05/2017   PLT 261 05/05/2017    Recent Labs Lab 05/04/17 2042 05/05/17 0255  NA 124* 132*  K 3.7 3.5  CL 90* 100*  CO2 26 26  BUN 12 7  CREATININE 0.91 0.40*  CALCIUM  8.5* 8.2*  PROT 7.0  --   BILITOT 0.7  --   ALKPHOS 101  --   ALT 18  --   AST 31  --   GLUCOSE 152* 92   Lab Results  Component Value Date   CKTOTAL 44 06/25/2013   CKMB 1.5 06/25/2013   TROPONINI 0.09 (HH) 05/05/2017    Lab Results  Component Value Date   CHOL 139 05/05/2017   Lab Results  Component Value Date   HDL 40 (L) 05/05/2017   Lab Results  Component Value Date   LDLCALC 92 05/05/2017   Lab Results  Component Value Date   TRIG 36 05/05/2017   Lab Results  Component Value Date   CHOLHDL 3.5 05/05/2017   No results found for: LDLDIRECT    Radiology: No results found.  EKG: Sinus rhythm, rate   ASSESSMENT AND PLAN:  1. Syncope, with history of recurrent vasovagal syncope 2. Borderline elevated troponin of 0.06, followed by 0.09, with nonspecific ST-T wave changes, in the absence of chest pain, with recent history of tightness across the scapular region that has since resolved. 3. Hyponatremia with history of such 4. Hypertension  Recommendations: 1. Agree with current therapy 2. Review 2D echocardiogram 3. Hold blood pressure medications at this time 4. Continue to trend troponin 5. Recommend nuclear stress test as outpatient.  Signed: Clabe Seal, PA-C 05/05/2017, 8:34 AM

## 2017-05-05 NOTE — Progress Notes (Signed)
*  PRELIMINARY RESULTS* Echocardiogram 2D Echocardiogram has been performed.  Sherrie Sport 05/05/2017, 2:17 PM

## 2017-05-05 NOTE — Progress Notes (Signed)
Patient had multiple drug allergies and sensitivities.  She unable to take most hypertension meds and Dr. Saralyn Pilar has not prescribed any more.   She continues to have orthostatic changes.  She did not feel dizzy or light headed when she stood, but did feel weak.

## 2017-05-05 NOTE — Progress Notes (Signed)
Patient arrived to 2A Room 236. Patient denies pain and all questions answered. Patient oriented to unit and Fall Safety Plan signed. Skin assessment completed with Alisa RN and skin intact. A&Ox4, VSS, and NSR on verified tele-box #[id number]. Nursing staff will continue to monitor for any changes in patient status. Earleen Reaper, RN

## 2017-05-05 NOTE — Care Management Important Message (Signed)
Important Message  Patient Details  Name: TINSLEE KLARE MRN: [id number] Date of Birth: [birth year]   Medicare Important Message Given:  Yes Signed IM notice given    Katrina Stack, RN 05/05/2017, 2:52 PM

## 2017-05-05 NOTE — Progress Notes (Signed)
PT Cancellation Note  Patient Details Name: Robin Fernandez MRN: [id number] DOB: 1929-04-25   Cancelled Treatment:    Reason Eval/Treat Not Completed: Other (comment). Consult received and chart reviewed. Pt currently with elevated trending up troponins with cardio consult pending. Per note, pt previously independent with all ADLs. Will hold therapy at this time. Will re-attempt when medically cleared if therapy still indicated.   Ashad Fawbush 05/05/2017, 9:05 AM  Greggory Stallion, PT, DPT [phone]

## 2017-05-06 LAB — BASIC METABOLIC PANEL
Anion gap: 10 (ref 5–15)
BUN: 7 mg/dL (ref 6–20)
CALCIUM: 9.2 mg/dL (ref 8.9–10.3)
CO2: 27 mmol/L (ref 22–32)
Chloride: 94 mmol/L — ABNORMAL LOW (ref 101–111)
Creatinine, Ser: 0.58 mg/dL (ref 0.44–1.00)
GFR calc non Af Amer: >60 mL/min (ref >60)
Glucose, Bld: 156 mg/dL — ABNORMAL HIGH (ref 65–99)
POTASSIUM: 4.1 mmol/L (ref 3.5–5.1)
Sodium: 131 mmol/L — ABNORMAL LOW (ref 135–145)

## 2017-05-06 MED ORDER — ASPIRIN 81 MG PO TBEC: 81.0000 mg | DELAYED_RELEASE_TABLET | Freq: Every day | ORAL | 0 refills | Status: DC

## 2017-05-06 NOTE — Progress Notes (Signed)
[facility] Cardiology  SUBJECTIVE: I feel good   Vitals:   05/06/17 0347 05/06/17 0835 05/06/17 0837 05/06/17 0839  BP: (!) 144/54 124/70 (!) 131/55 (!) 134/54  Pulse: 73 77 76 80  Resp: 16 18    Temp: 99.1 F (37.3 C) 97.9 F (36.6 C)    TempSrc: Oral Oral    SpO2: 97% 97%    Weight:      Height:         Intake/Output Summary (Last 24 hours) at 05/06/17 0959 Last data filed at 05/06/17 0300  Gross per 24 hour  Intake              480 ml  Output              650 ml  Net             -170 ml      PHYSICAL EXAM  General: Well developed, well nourished, in no acute distress HEENT:  Normocephalic and atramatic Neck:  No JVD.  Lungs: Clear bilaterally to auscultation and percussion. Heart: HRRR . Normal S1 and S2 without gallops or murmurs.  Abdomen: Bowel sounds are positive, abdomen soft and non-tender  Msk:  Back normal, normal gait. Normal strength and tone for age. Extremities: No clubbing, cyanosis or edema.   Neuro: Alert and oriented X 3. Psych:  Good affect, responds appropriately   LABS: Basic Metabolic Panel:  Recent Labs  05/05/17 0255 05/06/17 0836  NA 132* 131*  K 3.5 4.1  CL 100* 94*  CO2 26 27  GLUCOSE 92 156*  BUN 7 7  CREATININE 0.40* 0.58  CALCIUM 8.2* 9.2  MG 1.9  --   PHOS 3.0  --    Liver Function Tests:  Recent Labs  05/04/17 2042  AST 31  ALT 18  ALKPHOS 101  BILITOT 0.7  PROT 7.0  ALBUMIN 3.3*   No results for input(s): LIPASE, AMYLASE in the last 72 hours. CBC:  Recent Labs  05/04/17 2042 05/05/17 0255  WBC 7.2 4.5  NEUTROABS 5.4  --   HGB 13.1 12.4  HCT 37.9 35.7  MCV 91.9 91.5  PLT 283 261   Cardiac Enzymes:  Recent Labs  05/05/17 0255 05/05/17 0843 05/05/17 1441  TROPONINI 0.09* 0.06* 0.05*   BNP: Invalid input(s): POCBNP D-Dimer: No results for input(s): DDIMER in the last 72 hours. Hemoglobin A1C: No results for input(s): HGBA1C in the last 72 hours. Fasting Lipid Panel:  Recent Labs   05/05/17 0255  CHOL 139  HDL 40*  LDLCALC 92  TRIG 36  CHOLHDL 3.5   Thyroid Function Tests:  Recent Labs  05/05/17 0255  TSH 2.839   Anemia Panel: No results for input(s): VITAMINB12, FOLATE, FERRITIN, TIBC, IRON, RETICCTPCT in the last 72 hours.  No results found.   Echo LV EF 55-65%  TELEMETRY: Normal sinus rhythm:  ASSESSMENT AND PLAN:  Active Problems:   Syncope    1. Syncope, probable vasovagal, without recurrence, normal left ventricular function 2. Borderline elevated troponin, in the absence of chest pain  Recommendations  1. Agree with current therapy 2. Defer further cardiac diagnostics at this time 3. Consider outpatient stress test  Sign off for now, please call if any questions   Isaias Cowman, MD, PhD, [facility] 05/06/2017 9:59 AM

## 2017-05-06 NOTE — Discharge Instructions (Signed)
Hyponatremia Hyponatremia is when the amount of salt (sodium) in your blood is too low. When salt levels are low, your cells absorb extra water and they swell. The swelling happens throughout the body, but it mostly affects the brain. Follow these instructions at home:  Take medicines only as told by your doctor. Many medicines can make this condition worse. Talk with your doctor about any medicines that you are currently taking.  Carefully follow a recommended diet as told by your doctor.  Carefully follow instructions from your doctor about fluid restrictions.  Keep all follow-up visits as told by your doctor. This is important.  Do not drink alcohol. Contact a doctor if:  You feel sicker to your stomach (nauseous).  You feel more confused.  You feel more tired (fatigued).  Your headache gets worse.  You feel weaker.  Your symptoms go away and then they come back.  You have trouble following the diet instructions. Get help right away if:  You start to twitch and shake (have a seizure).  You pass out (faint).  You keep having watery poop (diarrhea).  You keep throwing up (vomiting). This information is not intended to replace advice given to you by your health care provider. Make sure you discuss any questions you have with your health care provider. Document Released: 07/27/2011 Document Revised: 04/21/2016 Document Reviewed: 11/10/2014 Elsevier Interactive Patient Education  2018 Elsevier Inc.  

## 2017-05-06 NOTE — Evaluation (Signed)
Physical Therapy Evaluation Patient Details Name: Robin Fernandez MRN: [id number] DOB: 1929-08-30 Today's Date: 05/06/2017   History of Present Illness  Pt is an 83 y.o. female presenting to hospital with weakness, dizziness, and gait instability x3-4 days with possible syncopal episode sitting in chair; pt also c/o upper back pain between scapula x3-4 days (tightness); (+) for orthostatic hypotension.  Pt admitted with syncope, elevated troponin with atypical chest/upper back pain, and hyponatremia.  PMH includes GERD, glaucoma, and vasovagal syncope.  Clinical Impression  Prior to hospital admission, pt was independent.  Pt lives with her husband (husband has caregivers and requires hoyer to w/c) in 1 level home with ramp to enter.  Nursing took orthostatic vitals beginning of session (supine 124/70; sitting 131/55; standing 134/54).  Currently pt is modified independent with bed mobility, SBA with transfers, and initially CGA (d/t pt c/o general weakness) but progressed to SBA with ambulation around nursing loop (without assistive device).  Pt steady without loss of balance with ambulation and pt was standing reaching for items in room (outside base of support) without any difficulties.  Pt would benefit from skilled PT to address noted impairments and functional limitations during hospital stay (see below for any additional details).  Upon hospital discharge, recommend pt discharge to home with support of family.  No further PT needs anticipated upon hospital discharge.    Follow Up Recommendations No PT follow up    Equipment Recommendations  None recommended by PT    Recommendations for Other Services       Precautions / Restrictions Precautions Precautions: Fall Restrictions Weight Bearing Restrictions: No      Mobility  Bed Mobility Overal bed mobility: Modified Independent             General bed mobility comments: Supine to sit with bed flat and sit to supine without  difficulties (pt initially asking for assist but demonstrated ability to perform when cued to perform by self)  Transfers Overall transfer level: Needs assistance Equipment used: None Transfers: Sit to/from [facility] Sit to Stand: Supervision Stand pivot transfers: Supervision       General transfer comment: pt initially holding onto therapist and asking for help but when cued to perform by self pt able without any assist and appeared steady/stable; toilet transfer with grab bar use SBA  Ambulation/Gait Ambulation/Gait assistance: Min guard;Supervision Ambulation Distance (Feet): 190 Feet Assistive device: None   Gait velocity: mild decreased cadence   General Gait Details: mild decreased B step length but steady without loss of balance  Stairs Stairs:  (Deferred d/t pt has ramp to enter 1 level home)          Wheelchair Mobility    Modified Rankin (Stroke Patients Only)       Balance Overall balance assessment: No apparent balance deficits (not formally assessed)                               Standardized Balance Assessment Standardized Balance Assessment :  (Pt ambulated with head turns R/L/up/down, increasing/decreasing speed, and turning 180 degrees and stopping without any loss of balance)           Pertinent Vitals/Pain Pain Assessment: No/denies pain  Vitals stable and WFL throughout treatment session.    Home Living Family/patient expects to be discharged to:: Private residence Living Arrangements: Spouse/significant other   Type of Home: House Home Access: [place]  Layout: One level Home Equipment: Grab bars - toilet;Grab bars - tub/shower;Tub bench      Prior Function Level of Independence: Independent         Comments: Pt reports her husband is "bedridden" and has 2 assist to hoyer into w/c (has home health aides that assist pt's husband).  Pt denies any falls in past 6 months.     Hand  Dominance        Extremity/Trunk Assessment   Upper Extremity Assessment Upper Extremity Assessment: Generalized weakness    Lower Extremity Assessment Lower Extremity Assessment: Generalized weakness    Cervical / Trunk Assessment Cervical / Trunk Assessment: Normal  Communication   Communication: HOH  Cognition Arousal/Alertness: Awake/alert Behavior During Therapy: WFL for tasks assessed/performed Overall Cognitive Status: Within Functional Limits for tasks assessed                                        General Comments General comments (skin integrity, edema, etc.): Nursing present beginning of session taking pt's orthostatic vitals.  Nursing cleared pt for participation in physical therapy.  Pt agreeable to PT session.    Exercises  Functional mobility   Assessment/Plan    PT Assessment Patient needs continued PT services  PT Problem List Decreased mobility;Decreased strength       PT Treatment Interventions DME instruction;Gait training;Functional mobility training;Therapeutic activities;Therapeutic exercise;Patient/family education    PT Goals (Current goals can be found in the Care Plan section)  Acute Rehab PT Goals Patient Stated Goal: to go home PT Goal Formulation: With patient Time For Goal Achievement: 05/20/17 Potential to Achieve Goals: Good    Frequency Min 2X/week   Barriers to discharge        Co-evaluation               AM-PAC PT "6 Clicks" Daily Activity  Outcome Measure Difficulty turning over in bed (including adjusting bedclothes, sheets and blankets)?: None Difficulty moving from lying on back to sitting on the side of the bed? : None Difficulty sitting down on and standing up from a chair with arms (e.g., wheelchair, bedside commode, etc,.)?: None Help needed moving to and from a bed to chair (including a wheelchair)?: A Little Help needed walking in hospital room?: A Little Help needed climbing 3-5 steps  with a railing? : A Little 6 Click Score: 21    End of Session Equipment Utilized During Treatment: Gait belt Activity Tolerance: Patient tolerated treatment well Patient left: in chair;with call bell/phone within reach;with chair alarm set Nurse Communication: Mobility status;Precautions PT Visit Diagnosis: Muscle weakness (generalized) (M62.81);Difficulty in walking, not elsewhere classified (R26.2)    Time: 3662-9476 PT Time Calculation (min) (ACUTE ONLY): 25 min   Charges:   PT Evaluation $PT Eval Low Complexity: 1 Procedure PT Treatments $Therapeutic Activity: 8-22 mins   PT G CodesLeitha Bleak, PT 05/06/17, 9:31 AM [phone]

## 2017-05-06 NOTE — Discharge Summary (Signed)
[facility] NAME: Robin Fernandez    MR#:  [id number]  DATE OF BIRTH:  1929-06-28  DATE OF ADMISSION:  05/04/2017 ADMITTING PHYSICIAN: Harvie Bridge, DO  DATE OF DISCHARGE: 05/06/2017  1:33 PM  PRIMARY CARE PHYSICIAN: Rusty Aus, MD    ADMISSION DIAGNOSIS:  Hyponatremia [E87.1] Weakness [R53.1] Elevated troponin [R74.8]  DISCHARGE DIAGNOSIS:  Active Problems:   Syncope   SECONDARY DIAGNOSIS:   Past Medical History:  Diagnosis Date  . GERD (gastroesophageal reflux disease)   . Glaucoma   . Vasovagal syncope     HOSPITAL COURSE:   1.  Orthostatic hypotension. The patient was given IV fluid hydration. Her blood pressure medications were stopped. Upon discharge the patient was not orthostatic and was feeling better.  I advised her to keep a blood pressure log of her blood pressures and bring to Dr. Sabra Heck as outpatient 2. Hyponatremia. Likely combination of not eating very well, hydrochlorothiazide and antidepressants. The hydrochlorothiazide and antidepressants were stopped. Appetite improved. Sodium upon discharge 131. Recommend checking a sodium as outpatient. 3. Elevated troponin. Seen by cardiology. No further cardiac workup in the hospital recommended. Echocardiogram showed a normal ejection fraction. Aspirin started. 4. GERD on ranitidine 5. History of glaucoma on eyedrops  DISCHARGE CONDITIONS:   Satisfactory  CONSULTS OBTAINED:  Treatment Team:  Isaias Cowman, MD Teodoro Spray, MD  DRUG ALLERGIES:   Allergies  Allergen Reactions  . Salicylates Hives    Aspirin-like analgesic, salicylates group--urticaria  . Alendronate Other (See Comments)  . Amoxil [Amoxicillin] Other (See Comments)    Diarrhea, Blood in Stool  . Azopt [Brinzolamide] Other (See Comments)    Tingling in arms and feet  . Deltasone [Prednisone]     Affects blood pressure  . Erythromycin Other (See Comments)    Diarrhea,  Blood in Stool   . Lumigan [Bimatoprost]     Nervousness  . Meloxicam Other (See Comments)  . Ofloxacin Other (See Comments)  . Parafon Forte Dsc [Chlorzoxazone] Other (See Comments)    "made very sick"  . Prevacid [Lansoprazole] Swelling    DISCHARGE MEDICATIONS:   Discharge Medication List as of 05/06/2017  1:18 PM    START taking these medications   Details  aspirin EC 81 MG EC tablet Take 1 tablet (81 mg total) by mouth daily., Starting Sun 05/07/2017, Normal      CONTINUE these medications which have NOT CHANGED   Details  ranitidine (ZANTAC) 150 MG tablet Take 1 tablet by mouth 2 (two) times daily., Starting Fri 04/21/2017, Historical Med    vitamin B-12 (CYANOCOBALAMIN) 1000 MCG tablet Take 1 tablet by mouth daily., Historical Med    Travoprost, BAK Free, (TRAVATAN) 0.004 % SOLN ophthalmic solution Place 1 drop into the left eye at bedtime., Historical Med      STOP taking these medications     FLUoxetine (PROZAC) 20 MG capsule      lisinopril-hydrochlorothiazide (PRINZIDE,ZESTORETIC) 10-12.5 MG tablet      sertraline (ZOLOFT) 25 MG tablet          DISCHARGE INSTRUCTIONS:   Follow-up with Dr. Sabra Heck as scheduled  If you experience worsening of your admission symptoms, develop shortness of breath, life threatening emergency, suicidal or homicidal thoughts you must seek medical attention immediately by calling 911 or calling your MD immediately  if symptoms less severe.  You Must read complete instructions/literature along with all the possible adverse reactions/side effects for all the Medicines you take  and that have been prescribed to you. Take any new Medicines after you have completely understood and accept all the possible adverse reactions/side effects.   Please note  You were cared for by a hospitalist during your hospital stay. If you have any questions about your discharge medications or the care you received while you were in the hospital after you are  discharged, you can call the unit and asked to speak with the hospitalist on call if the hospitalist that took care of you is not available. Once you are discharged, your primary care physician will handle any further medical issues. Please note that NO REFILLS for any discharge medications will be authorized once you are discharged, as it is imperative that you return to your primary care physician (or establish a relationship with a primary care physician if you do not have one) for your aftercare needs so that they can reassess your need for medications and monitor your lab values.    Today   CHIEF COMPLAINT:   Chief Complaint  Patient presents with  . Weakness    HISTORY OF PRESENT ILLNESS:  Robin Fernandez  is a 83 y.o. female presented with weakness and found to be orthostatic and have a low sodium.   VITAL SIGNS:  Blood pressure (!) 132/54, pulse 69, temperature 98.9 F (37.2 C), temperature source Oral, resp. rate 18, height 5' (1.524 m), weight 53 kg (116 lb 14.4 oz), SpO2 96 %.    PHYSICAL EXAMINATION:  GENERAL:  83 y.o.-year-old patient lying in the bed with no acute distress.  EYES: Pupils equal, round, reactive to light and accommodation. No scleral icterus. Extraocular muscles intact.  HEENT: Head atraumatic, normocephalic. Oropharynx and nasopharynx clear.  NECK:  Supple, no jugular venous distention. No thyroid enlargement, no tenderness.  LUNGS: Normal breath sounds bilaterally, no wheezing, rales,rhonchi or crepitation. No use of accessory muscles of respiration.  CARDIOVASCULAR: S1, S2 normal. No murmurs, rubs, or gallops.  ABDOMEN: Soft, non-tender, non-distended. Bowel sounds present. No organomegaly or mass.  EXTREMITIES: No pedal edema, cyanosis, or clubbing.  NEUROLOGIC: Cranial nerves II through XII are intact. Muscle strength 5/5 in all extremities. Sensation intact. Gait not checked.  PSYCHIATRIC: The patient is alert and oriented x 3.  SKIN: No obvious  rash, lesion, or ulcer.   DATA REVIEW:   CBC  Recent Labs Lab 05/05/17 0255  WBC 4.5  HGB 12.4  HCT 35.7  PLT 261    Chemistries   Recent Labs Lab 05/04/17 2042 05/05/17 0255 05/06/17 0836  NA 124* 132* 131*  K 3.7 3.5 4.1  CL 90* 100* 94*  CO2 26 26 27   GLUCOSE 152* 92 156*  BUN 12 7 7   CREATININE 0.91 0.40* 0.58  CALCIUM 8.5* 8.2* 9.2  MG  --  1.9  --   AST 31  --   --   ALT 18  --   --   ALKPHOS 101  --   --   BILITOT 0.7  --   --     Cardiac Enzymes  Recent Labs Lab 05/05/17 1441  TROPONINI 0.05*    Management plans discussed with the patient, family and they are in agreement.  CODE STATUS:     Code Status Orders        Start     Ordered   05/05/17 0011  Full code  Continuous     05/05/17 0010    Code Status History    Date Active Date Inactive Code Status  Order ID Comments User Context   This patient has a current code status but no historical code status.    Advance Directive Documentation     Most Recent Value  Type of Advance Directive  Healthcare Power of Attorney, Living will  Pre-existing out of facility DNR order (yellow form or pink MOST form)  -  "MOST" Form in Place?  -      TOTAL TIME TAKING CARE OF THIS PATIENT: 35 minutes.    Loletha Grayer M.D on 05/06/2017 at 4:34 PM  Between 7am to 6pm - Pager - [phone]  After 6pm go to [email or web address] - password [email or web address]  [employer] Office  [phone]  CC: Primary care physician; Rusty Aus, MD

## 2017-05-06 NOTE — Progress Notes (Signed)
Patient is being discharged home with her daughter.  Patient is back to her baseline condition.  The medication suspected to be the problem has been discontinued.

## 2017-05-06 NOTE — Plan of Care (Signed)
Problem: Health Behavior/Discharge Planning: Goal: Ability to manage health-related needs will improve Outcome: Completed/Met Date Met: 05/06/17 Patient is back to her baseline

## 2017-05-08 DIAGNOSIS — K219 Gastro-esophageal reflux disease without esophagitis: Secondary | ICD-10-CM | POA: Diagnosis not present

## 2017-05-08 DIAGNOSIS — I10 Essential (primary) hypertension: Secondary | ICD-10-CM | POA: Diagnosis not present

## 2017-05-08 DIAGNOSIS — R5383 Other fatigue: Secondary | ICD-10-CM | POA: Diagnosis not present

## 2017-05-08 DIAGNOSIS — E871 Hypo-osmolality and hyponatremia: Secondary | ICD-10-CM | POA: Diagnosis not present

## 2017-05-22 DIAGNOSIS — E871 Hypo-osmolality and hyponatremia: Secondary | ICD-10-CM | POA: Diagnosis not present

## 2017-06-26 DIAGNOSIS — H6983 Other specified disorders of Eustachian tube, bilateral: Secondary | ICD-10-CM | POA: Diagnosis not present

## 2017-06-26 DIAGNOSIS — I10 Essential (primary) hypertension: Secondary | ICD-10-CM | POA: Diagnosis not present

## 2017-07-04 DIAGNOSIS — D485 Neoplasm of uncertain behavior of skin: Secondary | ICD-10-CM | POA: Diagnosis not present

## 2017-07-04 DIAGNOSIS — X32XXXA Exposure to sunlight, initial encounter: Secondary | ICD-10-CM | POA: Diagnosis not present

## 2017-07-04 DIAGNOSIS — L400 Psoriasis vulgaris: Secondary | ICD-10-CM | POA: Diagnosis not present

## 2017-07-04 DIAGNOSIS — L570 Actinic keratosis: Secondary | ICD-10-CM | POA: Diagnosis not present

## 2017-07-04 DIAGNOSIS — D0439 Carcinoma in situ of skin of other parts of face: Secondary | ICD-10-CM | POA: Diagnosis not present

## 2017-07-04 DIAGNOSIS — Z08 Encounter for follow-up examination after completed treatment for malignant neoplasm: Secondary | ICD-10-CM | POA: Diagnosis not present

## 2017-07-04 DIAGNOSIS — Z85828 Personal history of other malignant neoplasm of skin: Secondary | ICD-10-CM | POA: Diagnosis not present

## 2017-08-07 DIAGNOSIS — E538 Deficiency of other specified B group vitamins: Secondary | ICD-10-CM | POA: Diagnosis not present

## 2017-08-07 DIAGNOSIS — Z0000 Encounter for general adult medical examination without abnormal findings: Secondary | ICD-10-CM | POA: Diagnosis not present

## 2017-08-14 DIAGNOSIS — E039 Hypothyroidism, unspecified: Secondary | ICD-10-CM | POA: Diagnosis not present

## 2017-08-14 DIAGNOSIS — Z79899 Other long term (current) drug therapy: Secondary | ICD-10-CM | POA: Diagnosis not present

## 2017-08-14 DIAGNOSIS — E538 Deficiency of other specified B group vitamins: Secondary | ICD-10-CM | POA: Diagnosis not present

## 2017-08-18 DIAGNOSIS — L905 Scar conditions and fibrosis of skin: Secondary | ICD-10-CM | POA: Diagnosis not present

## 2017-08-18 DIAGNOSIS — D0439 Carcinoma in situ of skin of other parts of face: Secondary | ICD-10-CM | POA: Diagnosis not present

## 2017-09-18 DIAGNOSIS — H40053 Ocular hypertension, bilateral: Secondary | ICD-10-CM | POA: Diagnosis not present

## 2017-10-02 DIAGNOSIS — N390 Urinary tract infection, site not specified: Secondary | ICD-10-CM | POA: Diagnosis not present

## 2017-10-17 DIAGNOSIS — K521 Toxic gastroenteritis and colitis: Secondary | ICD-10-CM | POA: Diagnosis not present

## 2017-10-18 DIAGNOSIS — K521 Toxic gastroenteritis and colitis: Secondary | ICD-10-CM | POA: Diagnosis not present

## 2017-10-26 DIAGNOSIS — E039 Hypothyroidism, unspecified: Secondary | ICD-10-CM | POA: Diagnosis not present

## 2017-11-19 ENCOUNTER — Observation Stay
Admission: EM | Admit: 2017-11-19 | Discharge: 2017-11-20 | Disposition: A | Discharge status: Home / Self Care | Payer: PPO | Attending: Internal Medicine | Admitting: Internal Medicine

## 2017-11-19 ENCOUNTER — Encounter: Payer: Self-pay | Admitting: Emergency Medicine

## 2017-11-19 ENCOUNTER — Other Ambulatory Visit: Payer: Self-pay

## 2017-11-19 ENCOUNTER — Emergency Department: Payer: PPO

## 2017-11-19 ENCOUNTER — Observation Stay: Payer: PPO

## 2017-11-19 DIAGNOSIS — R200 Anesthesia of skin: Secondary | ICD-10-CM | POA: Insufficient documentation

## 2017-11-19 DIAGNOSIS — R531 Weakness: Secondary | ICD-10-CM | POA: Insufficient documentation

## 2017-11-19 DIAGNOSIS — E871 Hypo-osmolality and hyponatremia: Secondary | ICD-10-CM | POA: Insufficient documentation

## 2017-11-19 DIAGNOSIS — Z79899 Other long term (current) drug therapy: Secondary | ICD-10-CM | POA: Diagnosis not present

## 2017-11-19 DIAGNOSIS — K219 Gastro-esophageal reflux disease without esophagitis: Secondary | ICD-10-CM | POA: Diagnosis not present

## 2017-11-19 DIAGNOSIS — I10 Essential (primary) hypertension: Secondary | ICD-10-CM | POA: Insufficient documentation

## 2017-11-19 DIAGNOSIS — R55 Syncope and collapse: Secondary | ICD-10-CM | POA: Insufficient documentation

## 2017-11-19 DIAGNOSIS — Z7982 Long term (current) use of aspirin: Secondary | ICD-10-CM | POA: Insufficient documentation

## 2017-11-19 DIAGNOSIS — R51 Headache: Secondary | ICD-10-CM | POA: Diagnosis not present

## 2017-11-19 DIAGNOSIS — G459 Transient cerebral ischemic attack, unspecified: Secondary | ICD-10-CM | POA: Diagnosis not present

## 2017-11-19 DIAGNOSIS — Z87891 Personal history of nicotine dependence: Secondary | ICD-10-CM | POA: Insufficient documentation

## 2017-11-19 DIAGNOSIS — I6523 Occlusion and stenosis of bilateral carotid arteries: Secondary | ICD-10-CM | POA: Diagnosis not present

## 2017-11-19 DIAGNOSIS — H409 Unspecified glaucoma: Secondary | ICD-10-CM | POA: Insufficient documentation

## 2017-11-19 DIAGNOSIS — I160 Hypertensive urgency: Secondary | ICD-10-CM | POA: Diagnosis not present

## 2017-11-19 LAB — CBC
HEMATOCRIT: 41 % (ref 35.0–47.0)
HEMOGLOBIN: 14 g/dL (ref 12.0–16.0)
MCH: 31.9 pg (ref 26.0–34.0)
MCHC: 34.1 g/dL (ref 32.0–36.0)
MCV: 93.4 fL (ref 80.0–100.0)
Platelets: 250 10*3/uL (ref 150–440)
RBC: 4.39 MIL/uL (ref 3.80–5.20)
RDW: 13.3 % (ref 11.5–14.5)
WBC: 4.1 10*3/uL (ref 3.6–11.0)

## 2017-11-19 LAB — DIFFERENTIAL
BASOS ABS: 0 10*3/uL (ref 0–0.1)
Basophils Relative: 1 %
EOS ABS: 0 10*3/uL (ref 0–0.7)
Eosinophils Relative: 1 %
LYMPHS ABS: 0.8 10*3/uL — AB (ref 1.0–3.6)
LYMPHS PCT: 21 %
MONOS PCT: 11 %
Monocytes Absolute: 0.5 10*3/uL (ref 0.2–0.9)
NEUTROS PCT: 66 %
Neutro Abs: 2.7 10*3/uL (ref 1.4–6.5)

## 2017-11-19 LAB — COMPREHENSIVE METABOLIC PANEL
ALK PHOS: 105 U/L (ref 38–126)
ALT: 15 U/L (ref 14–54)
AST: 28 U/L (ref 15–41)
Albumin: 4 g/dL (ref 3.5–5.0)
Anion gap: 7 (ref 5–15)
BILIRUBIN TOTAL: 0.9 mg/dL (ref 0.3–1.2)
BUN: 10 mg/dL (ref 6–20)
CALCIUM: 8.9 mg/dL (ref 8.9–10.3)
CO2: 24 mmol/L (ref 22–32)
CREATININE: 0.58 mg/dL (ref 0.44–1.00)
Chloride: 99 mmol/L — ABNORMAL LOW (ref 101–111)
GFR calc Af Amer: >60 mL/min (ref >60)
GFR calc non Af Amer: >60 mL/min (ref >60)
Glucose, Bld: 116 mg/dL — ABNORMAL HIGH (ref 65–99)
POTASSIUM: 3.5 mmol/L (ref 3.5–5.1)
Sodium: 130 mmol/L — ABNORMAL LOW (ref 135–145)
TOTAL PROTEIN: 6.9 g/dL (ref 6.5–8.1)

## 2017-11-19 LAB — PROTIME-INR
INR: 0.94
Prothrombin Time: 12.5 seconds (ref 11.4–15.2)

## 2017-11-19 LAB — GLUCOSE, CAPILLARY: GLUCOSE-CAPILLARY: 107 mg/dL — AB (ref 65–99)

## 2017-11-19 LAB — APTT: APTT: 27 s (ref 24–36)

## 2017-11-19 LAB — TROPONIN I

## 2017-11-19 MED ORDER — STROKE: EARLY STAGES OF RECOVERY BOOK
Freq: Once | Status: AC
  Administered 2017-11-19: 14:00:00

## 2017-11-19 MED ORDER — ASPIRIN EC 325 MG PO TBEC
325.0000 mg | DELAYED_RELEASE_TABLET | Freq: Every day | ORAL | Status: DC
  Administered 2017-11-20: 325 mg via ORAL
  Filled 2017-11-19: qty 1

## 2017-11-19 MED ORDER — ACETAMINOPHEN 325 MG PO TABS: 650.0000 mg | ORAL_TABLET | ORAL | Status: DC | PRN

## 2017-11-19 MED ORDER — SENNOSIDES-DOCUSATE SODIUM 8.6-50 MG PO TABS: 1.0000 | ORAL_TABLET | Freq: Every evening | ORAL | Status: DC | PRN

## 2017-11-19 MED ORDER — SODIUM CHLORIDE 0.9 % IV SOLN
INTRAVENOUS | Status: AC
  Administered 2017-11-19 – 2017-11-20 (×2): via INTRAVENOUS

## 2017-11-19 MED ORDER — DICYCLOMINE HCL 10 MG PO CAPS
10.0000 mg | ORAL_CAPSULE | Freq: Two times a day (BID) | ORAL | Status: DC | PRN
  Filled 2017-11-19: qty 1

## 2017-11-19 MED ORDER — METOPROLOL TARTRATE 25 MG PO TABS
25.0000 mg | ORAL_TABLET | Freq: Two times a day (BID) | ORAL | Status: DC
  Administered 2017-11-19 – 2017-11-20 (×2): 25 mg via ORAL
  Filled 2017-11-19 (×2): qty 1

## 2017-11-19 MED ORDER — PANTOPRAZOLE SODIUM 40 MG PO TBEC
40.0000 mg | DELAYED_RELEASE_TABLET | Freq: Every day | ORAL | Status: DC
  Administered 2017-11-19: 21:00:00 40 mg via ORAL
  Filled 2017-11-19: qty 1

## 2017-11-19 MED ORDER — ENOXAPARIN SODIUM 40 MG/0.4ML ~~LOC~~ SOLN
40.0000 mg | SUBCUTANEOUS | Status: DC
  Administered 2017-11-19: 40 mg via SUBCUTANEOUS
  Filled 2017-11-19: qty 0.4

## 2017-11-19 MED ORDER — ACETAMINOPHEN 650 MG RE SUPP: 650.0000 mg | RECTAL | Status: DC | PRN

## 2017-11-19 MED ORDER — AZELASTINE HCL 0.1 % NA SOLN
2.0000 | Freq: Two times a day (BID) | NASAL | Status: DC | PRN
  Filled 2017-11-19: qty 30

## 2017-11-19 MED ORDER — LATANOPROST 0.005 % OP SOLN
1.0000 [drp] | Freq: Every day | OPHTHALMIC | Status: DC
  Administered 2017-11-19: 1 [drp] via OPHTHALMIC
  Filled 2017-11-19: qty 2.5

## 2017-11-19 MED ORDER — ACETAMINOPHEN 160 MG/5ML PO SOLN
650.0000 mg | ORAL | Status: DC | PRN
Start: 2017-11-19 — End: 2017-11-20
  Filled 2017-11-19: qty 20.3

## 2017-11-19 NOTE — ED Provider Notes (Signed)
[facility] Emergency Department Provider Note ____________________________________________   I have reviewed the triage vital signs and the triage nursing note.  HISTORY  Chief Complaint Weakness and Code Stroke   Historian Patient and daughter  HPI Robin Fernandez is a 83 y.o. female who lives at home alone, states that she fell asleep in her chair last evening and then woke up around 11 PM with a terrible headache at the top of her head.  She states that she gets "arouras" in her vision at times like light spots, but stated that the most concerning thing yesterday was that she felt like she had numbness and tingling of her left arm and that she had problems with coordinating or picking up her pill.  This lasted essentially all night long.  Around 4 AM she took 2 baby aspirin.  She is never had a stroke or a TIA in the past.  No chest pain or trouble breathing.  No confusion or altered mental status.  She told her daughter this morning and who brought her in for evaluation.  Patient states that her symptoms are essentially now gone.  She also had some numbness of the left side of the tongue at the time.  Symptoms are moderate, currently resolved.     Past Medical History:  Diagnosis Date  . GERD (gastroesophageal reflux disease)   . Glaucoma   . Vasovagal syncope     Patient Active Problem List   Diagnosis Date Noted  . Syncope 05/04/2017    Past Surgical History:  Procedure Laterality Date  . ABDOMINAL HYSTERECTOMY    . EYE SURGERY      Prior to Admission medications   Medication Sig Start Date End Date Taking? Authorizing Provider  azelastine (ASTELIN) 0.1 % nasal spray Place 2 sprays into both nostrils 2 (two) times daily. 08/14/17  Yes [provider]  dicyclomine (BENTYL) 10 MG capsule Take 1 capsule by mouth 2 (two) times daily. 10/17/17  Yes [provider]  omeprazole (PRILOSEC) 20 MG capsule Take 1 capsule by mouth 2  (two) times daily. 10/17/17  Yes [provider]  Travoprost, BAK Free, (TRAVATAN) 0.004 % SOLN ophthalmic solution Place 1 drop into the left eye at bedtime.   Yes [provider]  aspirin EC 81 MG EC tablet Take 1 tablet (81 mg total) by mouth daily. Patient not taking: Reported on 11/19/2017 05/07/17   Loletha Grayer, MD    Allergies  Allergen Reactions  . Salicylates Hives    Aspirin-like analgesic, salicylates group--urticaria  . Alendronate Other (See Comments)  . Amoxil [Amoxicillin] Other (See Comments)    Diarrhea, Blood in Stool  . Azopt [Brinzolamide] Other (See Comments)    Tingling in arms and feet  . Deltasone [Prednisone]     Affects blood pressure  . Erythromycin Other (See Comments)    Diarrhea, Blood in Stool   . Lumigan [Bimatoprost]     Nervousness  . Meloxicam Other (See Comments)  . Ofloxacin Other (See Comments)  . Parafon Forte Dsc [Chlorzoxazone] Other (See Comments)    "made very sick"  . Prevacid [Lansoprazole] Swelling    No family history on file.  Social History Social History   Tobacco Use  . Smoking status: Former Smoker    Types: Cigarettes  . Smokeless tobacco: Never Used  Substance Use Topics  . Alcohol use: No  . Drug use: No    Review of Systems  Constitutional: Negative for fever. Eyes: Negative for  visual changes. ENT: Negative for sore throat. Cardiovascular: Negative for chest pain. Respiratory: Negative for shortness of breath. Gastrointestinal: Negative for abdominal pain, vomiting and diarrhea. Genitourinary: Negative for dysuria. Musculoskeletal: Negative for back pain. Skin: Negative for rash. Neurological: Positive for global top of the head headache last night currently gone.  ____________________________________________   PHYSICAL EXAM:  VITAL SIGNS: ED Triage Vitals  Enc Vitals Group     BP 11/19/17 0946 (!) 194/65     Pulse Rate 11/19/17 0946 76     Resp 11/19/17 0946 20     Temp  11/19/17 0946 98.4 F (36.9 C)     Temp Source 11/19/17 0946 Oral     SpO2 11/19/17 0946 97 %     Weight 11/19/17 0944 119 lb 4.8 oz (54.1 kg)     Height 11/19/17 0944 5\' 4"  (1.626 m)     Head Circumference --      Peak Flow --      Pain Score 11/19/17 0944 0     Pain Loc --      Pain Edu? --      Excl. in [place]? --      Constitutional: Alert and oriented. Well appearing and in no distress. HEENT   Head: Normocephalic and atraumatic.      Eyes: Conjunctivae are normal. Pupils equal and round.       Ears:         Nose: No congestion/rhinnorhea.   Mouth/Throat: Mucous membranes are moist.   Neck: No stridor. Cardiovascular/Chest: Normal rate, regular rhythm.  No murmurs, rubs, or gallops. Respiratory: Normal respiratory effort without tachypnea nor retractions. Breath sounds are clear and equal bilaterally. No wheezes/rales/rhonchi. Gastrointestinal: Soft. No distention, no guarding, no rebound. Nontender.    Genitourinary/rectal:Deferred Musculoskeletal: Nontender with normal range of motion in all extremities. No joint effusions.  No lower extremity tenderness.  No edema. Neurologic: No facial droop.  Cranial nerves II through X intact.  Coordination intact.  Normal speech and language. No gross or focal neurologic deficits are appreciated. Skin:  Skin is warm, dry and intact. No rash noted. Psychiatric: Mood and affect are normal. Speech and behavior are normal. Patient exhibits appropriate insight and judgment.   ____________________________________________  LABS (pertinent positives/negatives) I, Lisa Roca, MD the attending physician have reviewed the labs noted below.  Labs Reviewed  DIFFERENTIAL - Abnormal; Notable for the following components:      Result Value   Lymphs Abs 0.8 (*)    All other components within normal limits  COMPREHENSIVE METABOLIC PANEL - Abnormal; Notable for the following components:   Sodium 130 (*)    Chloride 99 (*)    Glucose, Bld  116 (*)    All other components within normal limits  GLUCOSE, CAPILLARY - Abnormal; Notable for the following components:   Glucose-Capillary 107 (*)    All other components within normal limits  PROTIME-INR  APTT  CBC  TROPONIN I  CBG MONITORING, ED    ____________________________________________    EKG I, Lisa Roca, MD, the attending physician have personally viewed and interpreted all ECGs.  65 bpm.  Normal sinus rhythm.  Narrow QS.  Normal axis.  Normal ST and T wave. ____________________________________________  RADIOLOGY All Xrays were viewed by me.  Imaging interpreted by Radiologist, and I, Lisa Roca, MD the attending physician have reviewed the radiologist interpretation noted below.  CT  Head:  IMPRESSION: 1. No acute intracranial abnormality. 2. Stable mild age-appropriate cortical atrophy and minimal to mild  chronic microvascular ischemic changes of the white matter.   __________________________________________  PROCEDURES  Procedure(s) performed: None  Critical Care performed: None   ____________________________________________  ED COURSE / ASSESSMENT AND PLAN  Pertinent labs & imaging results that were available during my care of the patient were reviewed by me and considered in my medical decision making (see chart for details).    Patient symptoms are consistent with stroke/TIA.  Given that symptoms are essentially resolved now on head CT initial is negative, working under presumptive diagnosis of likely TIA.  However she has not been worked up for this in the past.  The TIA symptoms lasted for several hours, and given her age, I do think that she warrants 24 hours observation and workup of TIA expeditiously.  Discussed with hospitalist for admission.  I went ahead and placed the MRI brain order, but not completed in the ER.  DIFFERENTIAL DIAGNOSIS: Differential diagnosis includes, but is not limited to, intracranial hemorrhage,  meningitis/encephalitis, previous head trauma, cavernous venous thrombosis, tension headache, temporal arteritis, migraine or migraine equivalent, idiopathic intracranial hypertension, and non-specific headache.  CONSULTATIONS:   Hospitalist for admission.  Patient / Family / Caregiver informed of clinical course, medical decision-making process, and agree with plan.   ___________________________________________   FINAL CLINICAL IMPRESSION(S) / ED DIAGNOSES   Final diagnoses:  TIA (transient ischemic attack)      ___________________________________________        Note: This dictation was prepared with Dragon dictation. Any transcriptional errors that result from this process are unintentional    Lisa Roca, MD 11/19/17 1121

## 2017-11-19 NOTE — ED Triage Notes (Addendum)
Pt states she was watching TV last night and woke up at 11pm with headache on top that was severe, pt states she tried to pick up something with her left hand but couldn't around 1130 pm.  Pt states she states her tongue was numb also on the left side.   Pt states she had several headaches throughout the day yesterday.  Pt states around 0400 she took 2 81mg  ASA.

## 2017-11-19 NOTE — ED Notes (Signed)
Patient transported to CT 

## 2017-11-19 NOTE — ED Notes (Addendum)
First nurse note  Brought in by family  States she developed severe headache and noticed some left hand numbness and tongue numbness

## 2017-11-19 NOTE — H&P (Signed)
[facility] NAME: Robin Fernandez    MR#:  [id number]  DATE OF BIRTH:  [birth year]  DATE OF ADMISSION:  11/19/2017  PRIMARY CARE PHYSICIAN: Rusty Aus, MD   REQUESTING/REFERRING PHYSICIAN: Reita Cliche  CHIEF COMPLAINT:  Left-sided weakness with numbness and headache  HISTORY OF PRESENT ILLNESS:  Robin Fernandez  is a 83 y.o. female with a known history of hypertension stop taking hydrochlorothiazide for low blood pressure and low sodium in the past, also stopped taking baby aspirin started developing terrible headache on the top of her head around 11 PM associated with spots in her vision. Patient was also having numbness and tingling in the left arm and left side of her tongue. Patient also noticed or coordination problems in the left upper extremity at the time. The symptoms lasted almost until 4 AM. At around 3 AM patient took 2 baby aspirin. Patient called her daughter who asked her to come to the ED. CT head is negative. Hospitalist team is called to admit the patient to rule out complete stroke. Blood pressure is elevated during my examination but patient's headache left-sided weakness and numbness are completely resolved.  PAST MEDICAL HISTORY:   Past Medical History:  Diagnosis Date  . GERD (gastroesophageal reflux disease)   . Glaucoma   . Vasovagal syncope     PAST SURGICAL HISTOIRY:   Past Surgical History:  Procedure Laterality Date  . ABDOMINAL HYSTERECTOMY    . EYE SURGERY      SOCIAL HISTORY:   Social History   Tobacco Use  . Smoking status: Former Smoker    Types: Cigarettes  . Smokeless tobacco: Never Used  Substance Use Topics  . Alcohol use: No    FAMILY HISTORY:  No family history on file.  DRUG ALLERGIES:   Allergies  Allergen Reactions  . Salicylates Hives    Aspirin-like analgesic, salicylates group--urticaria  . Alendronate Other (See Comments)  . Amoxil [Amoxicillin] Other (See  Comments)    Diarrhea, Blood in Stool  . Azopt [Brinzolamide] Other (See Comments)    Tingling in arms and feet  . Deltasone [Prednisone]     Affects blood pressure  . Erythromycin Other (See Comments)    Diarrhea, Blood in Stool   . Lumigan [Bimatoprost]     Nervousness  . Meloxicam Other (See Comments)  . Ofloxacin Other (See Comments)  . Parafon Forte Dsc [Chlorzoxazone] Other (See Comments)    "made very sick"  . Prevacid [Lansoprazole] Swelling    REVIEW OF SYSTEMS:  CONSTITUTIONAL: No fever, fatigue or weakness.  EYES: No blurred or double vision.  EARS, NOSE, AND THROAT: No tinnitus or ear pain.  RESPIRATORY: No cough, shortness of breath, wheezing or hemoptysis.  CARDIOVASCULAR: No chest pain, orthopnea, edema.  GASTROINTESTINAL: No nausea, vomiting, diarrhea or abdominal pain.  GENITOURINARY: No dysuria, hematuria.  ENDOCRINE: No polyuria, nocturia,  HEMATOLOGY: No anemia, easy bruising or bleeding SKIN: No rash or lesion. MUSCULOSKELETAL: No joint pain or arthritis.   NEUROLOGIC: No tingling, numbness, weakness.  PSYCHIATRY: No anxiety or depression.   MEDICATIONS AT HOME:   Prior to Admission medications   Medication Sig Start Date End Date Taking? Authorizing Provider  azelastine (ASTELIN) 0.1 % nasal spray Place 2 sprays into both nostrils 2 (two) times daily. 08/14/17  Yes [provider]  dicyclomine (BENTYL) 10 MG capsule Take 1 capsule by mouth 2 (two) times daily. 10/17/17  Yes [provider]  omeprazole ([place])  20 MG capsule Take 1 capsule by mouth 2 (two) times daily. 10/17/17  Yes [provider]  Travoprost, BAK Free, (TRAVATAN) 0.004 % SOLN ophthalmic solution Place 1 drop into the left eye at bedtime.   Yes [provider]  aspirin EC 81 MG EC tablet Take 1 tablet (81 mg total) by mouth daily. Patient not taking: Reported on 11/19/2017 05/07/17   Loletha Grayer, MD      VITAL SIGNS:  Blood pressure (!)  165/76, pulse 79, temperature 98.4 F (36.9 C), resp. rate (!) 25, height 5\' 4"  (1.626 m), weight 54.1 kg (119 lb 4.8 oz), SpO2 94 %.  PHYSICAL EXAMINATION:  GENERAL:  83 y.o.-year-old patient lying in the bed with no acute distress.  EYES: Pupils equal, round, reactive to light and accommodation. No scleral icterus. Extraocular muscles intact.  HEENT: Head atraumatic, normocephalic. Oropharynx and nasopharynx clear.  NECK:  Supple, no jugular venous distention. No thyroid enlargement, no tenderness.  LUNGS: Normal breath sounds bilaterally, no wheezing, rales,rhonchi or crepitation. No use of accessory muscles of respiration.  CARDIOVASCULAR: S1, S2 normal. No murmurs, rubs, or gallops.  ABDOMEN: Soft, nontender, nondistended. Bowel sounds present. No organomegaly or mass.  EXTREMITIES: No pedal edema, cyanosis, or clubbing.  NEUROLOGIC: Cranial nerves II through XII are intact. Muscle strength 5/5 in all extremities. Sensation intact. Gait not checked.  PSYCHIATRIC: The patient is alert and oriented x 3.  SKIN: No obvious rash, lesion, or ulcer.   LABORATORY PANEL:   CBC Recent Labs  Lab 11/19/17 0950  WBC 4.1  HGB 14.0  HCT 41.0  PLT 250   ------------------------------------------------------------------------------------------------------------------  Chemistries  Recent Labs  Lab 11/19/17 0950  NA 130*  K 3.5  CL 99*  CO2 24  GLUCOSE 116*  BUN 10  CREATININE 0.58  CALCIUM 8.9  AST 28  ALT 15  ALKPHOS 105  BILITOT 0.9   ------------------------------------------------------------------------------------------------------------------  Cardiac Enzymes Recent Labs  Lab 11/19/17 0950  TROPONINI <0.03   ------------------------------------------------------------------------------------------------------------------  RADIOLOGY:  Ct Head Wo Contrast  Result Date: 11/19/2017 CLINICAL DATA:  83 year old with a severe headache involving the vertex last night,  associated with an inability to use the left hand and numbness involving the left side of the tongue. EXAM: CT HEAD WITHOUT CONTRAST TECHNIQUE: Contiguous axial images were obtained from the base of the skull through the vertex without intravenous contrast. COMPARISON:  10/27/2016, 06/25/2013. FINDINGS: Brain: Mild age-appropriate cortical atrophy, unchanged. Minimal to mild changes of small vessel disease of the white matter, unchanged. No mass lesion. No midline shift. No acute hemorrhage or hematoma. No extra-axial fluid collections. No evidence of acute infarction. No interval change. Vascular: Moderate bilateral carotid siphon atherosclerosis. No hyperdense vessel. Skull: No skull fracture or other focal osseous abnormality involving the skull. Sinuses/Orbits: Visualized paranasal sinuses, bilateral mastoid air cells and bilateral middle ear cavities well-aerated. Orbits and globes unremarkable with evidence of bilateral scleral buckle procedures. Other: None. IMPRESSION: 1. No acute intracranial abnormality. 2. Stable mild age-appropriate cortical atrophy and minimal to mild chronic microvascular ischemic changes of the white matter. Electronically Signed   By: Evangeline Dakin M.D.   On: 11/19/2017 10:16    EKG:   Orders placed or performed during the hospital encounter of 11/19/17  . ED EKG  . ED EKG    IMPRESSION AND PLAN:   Darilyn Storbeck  is a 83 y.o. female with a known history of hypertension stop taking hydrochlorothiazide for low blood pressure and low sodium in the past,  also stopped taking baby aspirin started developing terrible headache on the top of her head around 11 PM associated with spots in her vision  # TIA CT head is negative Patient's symptoms resolved Will get complete stroke workup with MRI of the brain and carotid Dopplers and 2-D echocardiogram Monitor patient closely with neuro checks Patient has passed bedside swallow evaluation Heart healthy diet PT and OT  evaluation Neurology consult if needed patient was on aspirin 81 mg a was discontinued by the patient. We will resume  Pt needs tight control of her blood pressure Check TSH, fasting lipid panel and hemoglobin A1c   # Hypertension blood pressure uncontrolled Patient's hydrochlorothiazide was discontinued for hypotension and hyponatremia Will start her on metoprolol titrate as needed  #Hyponatremia Provided hydration with IV fluids and recheck in a.m.  #GERD-Protonix  #Glaucoma continue her eyedrops travatan    DVT prophylaxis with Lovenox          All the records are reviewed and case discussed with ED provider. Management plans discussed with the patient, daughter  and they are in agreement.  CODE STATUS: fc, daughter HCPOA  TOTAL TIME TAKING CARE OF THIS PATIENT: 43  minutes.   Note: This dictation was prepared with Dragon dictation along with smaller phrase technology. Any transcriptional errors that result from this process are unintentional.  Nicholes Mango M.D on 11/19/2017 at 11:51 AM  Between 7am to 6pm - Pager - [phone]  After 6pm go to [email or web address] - password [email or web address] [facility]  Office  [phone]  CC: Primary care physician; Rusty Aus, MD

## 2017-11-19 NOTE — ED Triage Notes (Signed)
Brought in via family  States she thinks she may have had a stroke  sxs' started last pm  Developed severe headache which woke her up  Then noticed numbness to tongue and hand

## 2017-11-20 ENCOUNTER — Observation Stay: Payer: PPO

## 2017-11-20 ENCOUNTER — Other Ambulatory Visit: Payer: Self-pay

## 2017-11-20 ENCOUNTER — Observation Stay
Admit: 2017-11-20 | Discharge: 2017-11-20 | Disposition: A | Discharge status: Home / Self Care | Payer: PPO | Attending: Internal Medicine | Admitting: Internal Medicine

## 2017-11-20 DIAGNOSIS — E871 Hypo-osmolality and hyponatremia: Secondary | ICD-10-CM | POA: Diagnosis not present

## 2017-11-20 DIAGNOSIS — R51 Headache: Secondary | ICD-10-CM | POA: Diagnosis not present

## 2017-11-20 DIAGNOSIS — K219 Gastro-esophageal reflux disease without esophagitis: Secondary | ICD-10-CM | POA: Diagnosis not present

## 2017-11-20 DIAGNOSIS — G459 Transient cerebral ischemic attack, unspecified: Secondary | ICD-10-CM | POA: Diagnosis not present

## 2017-11-20 DIAGNOSIS — H409 Unspecified glaucoma: Secondary | ICD-10-CM | POA: Diagnosis not present

## 2017-11-20 LAB — LIPID PANEL
Cholesterol: 153 mg/dL (ref 0–200)
HDL: 44 mg/dL (ref >40)
LDL CALC: 99 mg/dL (ref 0–99)
Total CHOL/HDL Ratio: 3.5 RATIO
Triglycerides: 52 mg/dL (ref <150)
VLDL: 10 mg/dL (ref 0–40)

## 2017-11-20 LAB — ECHOCARDIOGRAM COMPLETE
Height: 58 in
WEIGHTICAEL: 1876.8 [oz_av]

## 2017-11-20 LAB — BASIC METABOLIC PANEL
ANION GAP: 4 — AB (ref 5–15)
BUN: 5 mg/dL — ABNORMAL LOW (ref 6–20)
CO2: 26 mmol/L (ref 22–32)
Calcium: 8.5 mg/dL — ABNORMAL LOW (ref 8.9–10.3)
Chloride: 103 mmol/L (ref 101–111)
Creatinine, Ser: 0.51 mg/dL (ref 0.44–1.00)
Glucose, Bld: 103 mg/dL — ABNORMAL HIGH (ref 65–99)
POTASSIUM: 3.7 mmol/L (ref 3.5–5.1)
SODIUM: 133 mmol/L — AB (ref 135–145)

## 2017-11-20 LAB — HEMOGLOBIN A1C
HEMOGLOBIN A1C: 5.5 % (ref 4.8–5.6)
MEAN PLASMA GLUCOSE: 111.15 mg/dL

## 2017-11-20 LAB — TSH: TSH: 2.685 u[IU]/mL (ref 0.350–4.500)

## 2017-11-20 NOTE — Progress Notes (Signed)
OT Cancellation Note  Patient Details Name: Robin Fernandez MRN: [id number] DOB: 1929-07-26   Cancelled Treatment:     Order received for OT evaluation, patient screened this date and has no OT needs and appears back to baseline.  She was able to get up and go to the bathroom and perform grooming at the sink without assistance.  She denies any numbness, tingling or weakness in her extremities.  Please reconsult if needs arise.  Blanch Stang T Sirr Kabel, OTR/L, CLT   Alden Feagan 11/20/2017, 10:16 AM

## 2017-11-20 NOTE — Progress Notes (Signed)
Discharge instructions along with home medications and follow up gone over with patient and daughter. Both verbalize that they understood instructions. No prescriptions given to patient. IV and tele removed. Pt being discharged home on room air, no distress noted. Yahaira Bruski S Fenton, RN 

## 2017-11-20 NOTE — Care Management Obs Status (Signed)
[facility] NOTIFICATION   Patient Details  Name: Robin Fernandez MRN: [id number] Date of Birth: July 25, 1929   Medicare Observation Status Notification Given:  Yes    Shelbie Ammons, RN 11/20/2017, 10:48 AM

## 2017-11-20 NOTE — Care Management Note (Signed)
Case Management Note  Patient Details  Name: Robin Fernandez MRN: [id number] Date of Birth: 1929/01/25  Subjective/Objective:    Admitted to [facility] under observation with the diagnosis of TIA. Lives alone. Daughter is Curt Bears [phone]). Last seen Dr, Sabra Heck 10/17/17. Prescriptions are filled at [facility] or [facility] on [address]. No home health. No skilled facility. No home oxygen. Cane, shower chair, wheelchair, and rolling walker in the home. Takes care of all basic activities of daily living herself, drives. No falls. Fair appetite. Daughter will transport                Action/Plan: No Life Alert. Information about [facility] given   Expected Discharge Date:  11/20/17               Expected Discharge Plan:     In-House Referral:   yes  Discharge planning Services     Post Acute Care Choice:    Choice offered to:     DME Arranged:    DME Agency:     HH Arranged:    HH Agency:     Status of Service:     If discussed at [employer], dates discussed:    Additional Comments:  Shelbie Ammons, RN MSN CCM Care Management [phone] 11/20/2017, 10:49 AM

## 2017-11-20 NOTE — Progress Notes (Signed)
Physical Therapy Evaluation Patient Details Name: Robin Fernandez MRN: [id number] DOB: Sep 01, 1929 Today's Date: 11/20/2017   History of Present Illness  Robin Fernandez  is a 83 y.o. female with a known history of hypertension stop taking hydrochlorothiazide for low blood pressure and low sodium in the past, also stopped taking baby aspirin started developing terrible headache on the top of her head around 11 PM associated with spots in her vision. Patient was also having numbness and tingling in the left arm and left side of her tongue. Patient also noticed or coordination problems in the left upper extremity at the time. The symptoms lasted almost until 4 AM. At around 3 AM patient took 2 baby aspirin. Patient called her daughter who asked her to come to the ED. CT head is negative. Hospitalist team is called to admit the patient to rule out complete stroke. Blood pressure is elevated during my examination but patient's headache left-sided weakness and numbness are completely resolved.  Clinical Impression  No focal deficits in strength identified. Pt denies numbness/tingling. Negative pronator drift, finger to nose, and facial strength testing. Pt is modified independent for bed mobility. HOB elevated, use of bed rails. Mild increase in time but no external support needed. Pt demonstrates good safety/stability with transfers. No UE support in standing required to stabilize. Pt able to complete a full lap around RN station without an assistive device. Performs horizontal and vertical head turns without lateral gait deviation or slowing. Pt denies DOE with ambulation. No deficits identified in ambulation. Positive Rhomberg and single leg balance of approximately 5s on both LEs. Higher level balance deficits that pt can follow-up with PCP for OP PT if desired but not necessary at this time and pt not interest. No follow-up needed and no DME. PT order will be completed. Please enter new order if status or  needs change.    Follow Up Recommendations No PT follow up    Equipment Recommendations  None recommended by PT    Recommendations for Other Services       Precautions / Restrictions Precautions Precautions: None Restrictions Weight Bearing Restrictions: No      Mobility  Bed Mobility Overal bed mobility: Modified Independent             General bed mobility comments: HOB elevated, use of bed rails. Mild increase in time but no external support needed  Transfers Overall transfer level: Needs assistance Equipment used: Rolling walker (2 wheeled) Transfers: Sit to/from Stand Sit to Stand: Supervision         General transfer comment: Pt demonstrates good safety/stability with transfers. No UE support in standing required to stabilize  Ambulation/Gait Ambulation/Gait assistance: Supervision Ambulation Distance (Feet): 200 Feet Assistive device: None Gait Pattern/deviations: WFL(Within Functional Limits)   Gait velocity interpretation: at or above normal speed for age/gender General Gait Details: Pt able to complete a full lap around RN station. Performs horizontal and vertical head turns without lateral gait deviation or slowing. Pt denies DOE with ambulation. No deficits identified  Financial trader Rankin (Stroke Patients Only)       Balance Overall balance assessment: Modified Independent Sitting-balance support: No upper extremity supported Sitting balance-Leahy Scale: Good     Standing balance support: No upper extremity supported Standing balance-Leahy Scale: Good Standing balance comment: Positive Rhomberg, single leg balance approximately 5s on each side  Pertinent Vitals/Pain Pain Assessment: No/denies pain    Home Living Family/patient expects to be discharged to:: Private residence Living Arrangements: Alone Available Help at Discharge: Family Type of Home:  House Home Access: [place]: One [place]: Grab bars - toilet;Grab bars - tub/shower;Tub bench;Walker - 2 wheels;Wheelchair - manual      Prior Function Level of Independence: Independent         Comments: Pt reports she is independent with ADLs/IADLs. Ambulates without assistive device. No falls in the last 12 months     Hand Dominance   Dominant Hand: Right    Extremity/Trunk Assessment   Upper Extremity Assessment Upper Extremity Assessment: Overall WFL for tasks assessed    Lower Extremity Assessment Lower Extremity Assessment: Overall WFL for tasks assessed       Communication   Communication: No difficulties  Cognition Arousal/Alertness: Awake/alert Behavior During Therapy: WFL for tasks assessed/performed Overall Cognitive Status: Within Functional Limits for tasks assessed                                        General Comments      Exercises     Assessment/Plan    PT Assessment Patent does not need any further PT services  PT Problem List         PT Treatment Interventions      PT Goals (Current goals can be found in the Care Plan section)  Acute Rehab PT Goals PT Goal Formulation: All assessment and education complete, DC therapy    Frequency     Barriers to discharge        Co-evaluation               AM-PAC PT "6 Clicks" Daily Activity  Outcome Measure Difficulty turning over in bed (including adjusting bedclothes, sheets and blankets)?: None Difficulty moving from lying on back to sitting on the side of the bed? : None Difficulty sitting down on and standing up from a chair with arms (e.g., wheelchair, bedside commode, etc,.)?: None Help needed moving to and from a bed to chair (including a wheelchair)?: None Help needed walking in hospital room?: None Help needed climbing 3-5 steps with a railing? : None 6 Click Score: 24    End of Session Equipment Utilized During  Treatment: Gait belt Activity Tolerance: Patient tolerated treatment well Patient left: in bed;with call bell/phone within reach Nurse Communication: Mobility status PT Visit Diagnosis: Unsteadiness on feet (R26.81)    Time: 3762-8315 PT Time Calculation (min) (ACUTE ONLY): 17 min   Charges:   PT Evaluation $PT Eval Low Complexity: 1 Low     PT G Codes:   PT G-Codes **NOT FOR INPATIENT CLASS** Functional Assessment Tool Used: AM-PAC 6 Clicks Basic Mobility Functional Limitation: Mobility: Walking and moving around Mobility: Walking and Moving Around Current Status (V7616): 0 percent impaired, limited or restricted Mobility: Walking and Moving Around Goal Status (W7371): 0 percent impaired, limited or restricted Mobility: Walking and Moving Around Discharge Status (G6269): 0 percent impaired, limited or restricted    Phillips Grout PT, DPT    Robin Fernandez 11/20/2017, 10:07 AM

## 2017-11-20 NOTE — Discharge Instructions (Signed)
[facility] [place] [facility] ° °DIET:  °Cardiac diet ° °DISCHARGE CONDITION:  °Good ° °ACTIVITY:  °Activity as tolerated ° °OXYGEN:  °Home Oxygen: No. °  °Oxygen Delivery: room air ° °DISCHARGE LOCATION:  °home  ° ° °ADDITIONAL DISCHARGE INSTRUCTION: ° ° °If you experience worsening of your admission symptoms, develop shortness of breath, life threatening emergency, suicidal or homicidal thoughts you must seek medical attention immediately by calling 911 or calling your MD immediately  if symptoms less severe. ° °You Must read complete instructions/literature along with all the possible adverse reactions/side effects for all the Medicines you take and that have been prescribed to you. Take any new Medicines after you have completely understood and accpet all the possible adverse reactions/side effects.  ° °Please note ° °You were cared for by a hospitalist during your hospital stay. If you have any questions about your discharge medications or the care you received while you were in the hospital after you are discharged, you can call the unit and asked to speak with the hospitalist on call if the hospitalist that took care of you is not available. Once you are discharged, your primary care physician will handle any further medical issues. Please note that NO REFILLS for any discharge medications will be authorized once you are discharged, as it is imperative that you return to your primary care physician (or establish a relationship with a primary care physician if you do not have one) for your aftercare needs so that they can reassess your need for medications and monitor your lab values. ° ° °

## 2017-11-20 NOTE — Discharge Summary (Signed)
[facility], 83 y.o., DOB Oct 08, 1929, MRN [id number]. Admission date: 11/19/2017 Discharge Date 11/20/2017 Primary MD Rusty Aus, MD Admitting Physician Nicholes Mango, MD  Admission Diagnosis  TIA (transient ischemic attack) [G45.9]  Discharge Diagnosis   Active Problems:   TIA (transient ischemic attack)    Essential hypertension GERD Glaucoma Vasovagal syncope    Hospital Course Patient is a 83 year old female with known history of hypertension and stopped taking her hydrochlorothiazide due to low blood pressure and low sodium in the past who presented with headache and then look difficulty with picking up her medications on the left side.  Patient was admitted and evaluated for a TIA.  Workup so far has been negative including MRI of the brain.  She had her echo that needs to be followed however she is back to baseline and stable for discharge.  Patient recommended to check her blood pressure 2 times daily to keep a log to take her primary care provider I have also instructed her to start taking aspirin.            Consults  None  Significant Tests:  See full reports for all details     Ct Head Wo Contrast  Result Date: 11/19/2017 CLINICAL DATA:  83 year old with a severe headache involving the vertex last night, associated with an inability to use the left hand and numbness involving the left side of the tongue. EXAM: CT HEAD WITHOUT CONTRAST TECHNIQUE: Contiguous axial images were obtained from the base of the skull through the vertex without intravenous contrast. COMPARISON:  10/27/2016, 06/25/2013. FINDINGS: Brain: Mild age-appropriate cortical atrophy, unchanged. Minimal to mild changes of small vessel disease of the white matter, unchanged. No mass lesion. No midline shift. No acute hemorrhage or hematoma. No extra-axial fluid collections. No evidence of acute infarction. No interval change. Vascular: Moderate  bilateral carotid siphon atherosclerosis. No hyperdense vessel. Skull: No skull fracture or other focal osseous abnormality involving the skull. Sinuses/Orbits: Visualized paranasal sinuses, bilateral mastoid air cells and bilateral middle ear cavities well-aerated. Orbits and globes unremarkable with evidence of bilateral scleral buckle procedures. Other: None. IMPRESSION: 1. No acute intracranial abnormality. 2. Stable mild age-appropriate cortical atrophy and minimal to mild chronic microvascular ischemic changes of the white matter. Electronically Signed   By: Evangeline Dakin M.D.   On: 11/19/2017 10:16   Mr Brain Wo Contrast  Result Date: 11/20/2017 CLINICAL DATA:  Acute headache.  Normal neuro exam. EXAM: MRI HEAD WITHOUT CONTRAST MRA HEAD WITHOUT CONTRAST TECHNIQUE: Multiplanar, multiecho pulse sequences of the brain and surrounding structures were obtained without intravenous contrast. Angiographic images of the head were obtained using MRA technique without contrast. COMPARISON:  Head CT from yesterday FINDINGS: MRI HEAD FINDINGS Brain: No acute infarction, hemorrhage, hydrocephalus, extra-axial collection or mass lesion. Mild for age patchy FLAIR hyperintensity in the cerebral white matter consistent with chronic small vessel ischemia. Remote left subinsular and caudal thalamic groove remote micro hemorrhages, nonspecific. Normal brain volume for age. Vascular: Major flow voids are preserved. Skull and upper cervical spine: Negative for marrow lesion. Sinuses/Orbits: Partial bilateral mastoid opacification, right more than left, with negative nasopharynx. Mild mastoid opacification was seen on the right on a 2017 CT. Bilateral cataract resection. No sinusitis. MRA HEAD FINDINGS Symmetric carotid and vertebral arteries. Hypoplastic left P1 segment. No major branch occlusion or flow limiting stenosis. Luminal irregularity of bilateral medium size vessels that is likely a combination of atherosclerosis  and artifact.  Negative for beading or aneurysm. IMPRESSION: 1. No acute finding.  Unremarkable appearance of the brain for age. 2. Intracranial atherosclerosis. No major vessel occlusion or flow limiting stenosis. 3. Mild bilateral mastoid opacification with negative nasopharynx. Electronically Signed   By: Monte Fantasia M.D.   On: 11/20/2017 12:57   US Carotid Bilateral (at [facility] Only)  Result Date: 11/19/2017 CLINICAL DATA:  Left hand numbness.  TIA. EXAM: BILATERAL CAROTID DUPLEX ULTRASOUND TECHNIQUE: Pearline Cables scale imaging, color Doppler and duplex ultrasound were performed of bilateral carotid and vertebral arteries in the neck. COMPARISON:  10/27/2016 FINDINGS: Criteria: Quantification of carotid stenosis is based on velocity parameters that correlate the residual internal carotid diameter with NASCET-based stenosis levels, using the diameter of the distal internal carotid lumen as the denominator for stenosis measurement. The following velocity measurements were obtained: RIGHT ICA:  74 cm/sec CCA:  64 cm/sec SYSTOLIC ICA/CCA RATIO:  1.3 DIASTOLIC ICA/CCA RATIO:  1.6 ECA:  109 cm/sec LEFT ICA:  87 cm/sec CCA:  73 cm/sec SYSTOLIC ICA/CCA RATIO:  1.2 DIASTOLIC ICA/CCA RATIO:  0.9 ECA:  98 cm/sec RIGHT CAROTID ARTERY: Right carotid arteries are patent without significant plaque or stenosis. External carotid artery is patent with normal waveform. Normal waveforms and velocities in the internal carotid artery. RIGHT VERTEBRAL ARTERY: Antegrade flow and normal waveform in the right vertebral artery. LEFT CAROTID ARTERY: Minimal plaque or intimal thickening in the left common carotid artery. Intimal thickening or minimal plaque at the left carotid bulb. External carotid artery is patent with normal waveform. Normal waveforms and velocities in the internal carotid artery. LEFT VERTEBRAL ARTERY: Antegrade flow and normal waveform in the left vertebral artery. IMPRESSION: No significant carotid artery  stenosis. Minimal plaque and intimal thickening in the left common carotid artery and left carotid bulb. Patent vertebral arteries with antegrade flow. Electronically Signed   By: Markus Daft M.D.   On: 11/19/2017 15:57   Mr Jodene Nam Head/brain KZ Cm  Result Date: 11/20/2017 CLINICAL DATA:  Acute headache.  Normal neuro exam. EXAM: MRI HEAD WITHOUT CONTRAST MRA HEAD WITHOUT CONTRAST TECHNIQUE: Multiplanar, multiecho pulse sequences of the brain and surrounding structures were obtained without intravenous contrast. Angiographic images of the head were obtained using MRA technique without contrast. COMPARISON:  Head CT from yesterday FINDINGS: MRI HEAD FINDINGS Brain: No acute infarction, hemorrhage, hydrocephalus, extra-axial collection or mass lesion. Mild for age patchy FLAIR hyperintensity in the cerebral white matter consistent with chronic small vessel ischemia. Remote left subinsular and caudal thalamic groove remote micro hemorrhages, nonspecific. Normal brain volume for age. Vascular: Major flow voids are preserved. Skull and upper cervical spine: Negative for marrow lesion. Sinuses/Orbits: Partial bilateral mastoid opacification, right more than left, with negative nasopharynx. Mild mastoid opacification was seen on the right on a 2017 CT. Bilateral cataract resection. No sinusitis. MRA HEAD FINDINGS Symmetric carotid and vertebral arteries. Hypoplastic left P1 segment. No major branch occlusion or flow limiting stenosis. Luminal irregularity of bilateral medium size vessels that is likely a combination of atherosclerosis and artifact. Negative for beading or aneurysm. IMPRESSION: 1. No acute finding.  Unremarkable appearance of the brain for age. 2. Intracranial atherosclerosis. No major vessel occlusion or flow limiting stenosis. 3. Mild bilateral mastoid opacification with negative nasopharynx. Electronically Signed   By: Monte Fantasia M.D.   On: 11/20/2017 12:57       Today   Subjective:    Gweneth Dimitri patient doing better denies any symptoms currently  Objective:   Blood pressure Marland Kitchen)  145/57, pulse 65, temperature 98.4 F (36.9 C), temperature source Oral, resp. rate 18, height 4\' 10"  (1.473 m), weight 117 lb 4.8 oz (53.2 kg), SpO2 96 %.  .  Intake/Output Summary (Last 24 hours) at 11/20/2017 1338 Last data filed at 11/20/2017 0318 Gross per 24 hour  Intake 1303.75 ml  Output -  Net 1303.75 ml    Exam VITAL SIGNS: Blood pressure (!) 145/57, pulse 65, temperature 98.4 F (36.9 C), temperature source Oral, resp. rate 18, height 4\' 10"  (1.473 m), weight 117 lb 4.8 oz (53.2 kg), SpO2 96 %.  GENERAL:  83 y.o.-year-old patient lying in the bed with no acute distress.  EYES: Pupils equal, round, reactive to light and accommodation. No scleral icterus. Extraocular muscles intact.  HEENT: Head atraumatic, normocephalic. Oropharynx and nasopharynx clear.  NECK:  Supple, no jugular venous distention. No thyroid enlargement, no tenderness.  LUNGS: Normal breath sounds bilaterally, no wheezing, rales,rhonchi or crepitation. No use of accessory muscles of respiration.  CARDIOVASCULAR: S1, S2 normal. No murmurs, rubs, or gallops.  ABDOMEN: Soft, nontender, nondistended. Bowel sounds present. No organomegaly or mass.  EXTREMITIES: No pedal edema, cyanosis, or clubbing.  NEUROLOGIC: Cranial nerves II through XII are intact. Muscle strength 5/5 in all extremities. Sensation intact. Gait not checked.  PSYCHIATRIC: The patient is alert and oriented x 3.  SKIN: No obvious rash, lesion, or ulcer.   Data Review     CBC w Diff:  Lab Results  Component Value Date   WBC 4.1 11/19/2017   HGB 14.0 11/19/2017   HGB 15.6 07/06/2014   HCT 41.0 11/19/2017   HCT 47.7 (H) 07/06/2014   PLT 250 11/19/2017   PLT 250 07/06/2014   LYMPHOPCT 21 11/19/2017   LYMPHOPCT 16.0 06/26/2013   MONOPCT 11 11/19/2017   MONOPCT 11.2 06/26/2013   EOSPCT 1 11/19/2017   EOSPCT 2.8 06/26/2013    BASOPCT 1 11/19/2017   BASOPCT 0.6 06/26/2013   CMP:  Lab Results  Component Value Date   NA 133 (L) 11/20/2017   NA 133 (L) 07/06/2014   K 3.7 11/20/2017   K 3.9 07/06/2014   CL 103 11/20/2017   CL 96 (L) 07/06/2014   CO2 26 11/20/2017   CO2 28 07/06/2014   BUN 5 (L) 11/20/2017   BUN 10 07/06/2014   CREATININE 0.51 11/20/2017   CREATININE 0.83 07/06/2014   PROT 6.9 11/19/2017   PROT 8.4 (H) 07/06/2014   ALBUMIN 4.0 11/19/2017   ALBUMIN 3.9 07/06/2014   BILITOT 0.9 11/19/2017   BILITOT 0.6 07/06/2014   ALKPHOS 105 11/19/2017   ALKPHOS 137 (H) 07/06/2014   AST 28 11/19/2017   AST 29 07/06/2014   ALT 15 11/19/2017   ALT 30 07/06/2014  .  Micro Results No results found for this or any previous visit (from the past 240 hour(s)).      Code Status Orders  (From admission, onward)        Start     Ordered   11/19/17 1303  Full code  Continuous     11/19/17 1302    Code Status History    Date Active Date Inactive Code Status Order ID Comments User Context   05/05/2017 00:10 05/06/2017 16:53 Full Code [id number]  Harvie Bridge, DO Inpatient    Advance Directive Documentation     Most Recent Value  Type of Advance Directive  Healthcare Power of Attorney, Living will  Pre-existing out of facility DNR order (yellow form or pink MOST form)  No data  "MOST" Form in Place?  No data          Follow-up Information    Rusty Aus, MD Follow up in 1 week(s).   Specialty:  Internal Medicine Contact information: [address] [place] [address] [phone]           Discharge Medications   Allergies as of 11/20/2017      Reactions   Salicylates Hives   Aspirin-like analgesic, salicylates group--urticaria   Alendronate Other (See Comments)   Amoxil [amoxicillin] Other (See Comments)   Diarrhea, Blood in Stool   Azopt [brinzolamide] Other (See Comments)   Tingling in arms and feet   Deltasone  [prednisone]    Affects blood pressure   Erythromycin Other (See Comments)   Diarrhea, Blood in Stool   Lumigan [bimatoprost]    Nervousness   Meloxicam Other (See Comments)   Ofloxacin Other (See Comments)   Parafon Forte Dsc [chlorzoxazone] Other (See Comments)   "made very sick"   Prevacid [lansoprazole] Swelling      Medication List    TAKE these medications   aspirin 81 MG EC tablet Take 1 tablet (81 mg total) by mouth daily.   azelastine 0.1 % nasal spray Commonly known as:  ASTELIN Place 2 sprays into both nostrils 2 (two) times daily.   dicyclomine 10 MG capsule Commonly known as:  BENTYL Take 1 capsule by mouth 2 (two) times daily.   omeprazole 20 MG capsule Commonly known as:  PRILOSEC Take 1 capsule by mouth 2 (two) times daily.   Travoprost (BAK Free) 0.004 % Soln ophthalmic solution Commonly known as:  TRAVATAN Place 1 drop into the left eye at bedtime.   vitamin B-12 500 MCG tablet Commonly known as:  CYANOCOBALAMIN Take 1,000 mcg by mouth daily.   vitamin C 500 MG tablet Commonly known as:  ASCORBIC ACID Take 500 mg by mouth daily.          Total Time in preparing paper work, data evaluation and todays exam - 35 minutes  Dustin Flock M.D on 11/20/2017 at 1:38 PM  [facility]   Office  [phone]

## 2017-11-20 NOTE — Progress Notes (Signed)
SLP Cancellation Note  Patient Details Name: Robin Fernandez MRN: [id number] DOB: 1929/03/24   Cancelled treatment:       Reason Eval/Treat Not Completed: SLP screened, no needs identified, will sign off(chart reviewed; consulted NSG. Met w/ pt.). Pt denied any difficulty swallowing and is currently on a regular diet; tolerates swallowing pills w/ water per NSG. Pt conversed at conversational level w/out deficits noted; pt and family denied any speech-language deficits.  No further skilled ST services indicated as pt appears at her baseline. Pt agreed. NSG to reconsult if any change in status.     Orinda Kenner, MS, CCC-SLP Watson,Katherine 11/20/2017, 9:44 AM

## 2017-11-24 DIAGNOSIS — G459 Transient cerebral ischemic attack, unspecified: Secondary | ICD-10-CM | POA: Diagnosis not present

## 2017-11-24 DIAGNOSIS — E871 Hypo-osmolality and hyponatremia: Secondary | ICD-10-CM | POA: Diagnosis not present

## 2017-12-15 DIAGNOSIS — E871 Hypo-osmolality and hyponatremia: Secondary | ICD-10-CM | POA: Diagnosis not present

## 2017-12-15 DIAGNOSIS — E039 Hypothyroidism, unspecified: Secondary | ICD-10-CM | POA: Diagnosis not present

## 2018-02-05 DIAGNOSIS — Z79899 Other long term (current) drug therapy: Secondary | ICD-10-CM | POA: Diagnosis not present

## 2018-02-05 DIAGNOSIS — E039 Hypothyroidism, unspecified: Secondary | ICD-10-CM | POA: Diagnosis not present

## 2018-02-05 DIAGNOSIS — E538 Deficiency of other specified B group vitamins: Secondary | ICD-10-CM | POA: Diagnosis not present

## 2018-02-12 DIAGNOSIS — E039 Hypothyroidism, unspecified: Secondary | ICD-10-CM | POA: Diagnosis not present

## 2018-02-12 DIAGNOSIS — Z0000 Encounter for general adult medical examination without abnormal findings: Secondary | ICD-10-CM | POA: Diagnosis not present

## 2018-02-12 DIAGNOSIS — E538 Deficiency of other specified B group vitamins: Secondary | ICD-10-CM | POA: Diagnosis not present

## 2018-02-12 DIAGNOSIS — Z79899 Other long term (current) drug therapy: Secondary | ICD-10-CM | POA: Diagnosis not present

## 2018-03-05 DIAGNOSIS — L570 Actinic keratosis: Secondary | ICD-10-CM | POA: Diagnosis not present

## 2018-03-05 DIAGNOSIS — Z08 Encounter for follow-up examination after completed treatment for malignant neoplasm: Secondary | ICD-10-CM | POA: Diagnosis not present

## 2018-03-05 DIAGNOSIS — L720 Epidermal cyst: Secondary | ICD-10-CM | POA: Diagnosis not present

## 2018-03-05 DIAGNOSIS — Z85828 Personal history of other malignant neoplasm of skin: Secondary | ICD-10-CM | POA: Diagnosis not present

## 2018-03-26 DIAGNOSIS — H40053 Ocular hypertension, bilateral: Secondary | ICD-10-CM | POA: Diagnosis not present

## 2018-04-18 ENCOUNTER — Encounter: Payer: Self-pay | Admitting: Emergency Medicine

## 2018-04-18 ENCOUNTER — Emergency Department
Admission: EM | Admit: 2018-04-18 | Discharge: 2018-04-19 | Disposition: A | Discharge status: Home / Self Care | Payer: PPO | Attending: Emergency Medicine | Admitting: Emergency Medicine

## 2018-04-18 ENCOUNTER — Other Ambulatory Visit: Payer: Self-pay

## 2018-04-18 DIAGNOSIS — Z7982 Long term (current) use of aspirin: Secondary | ICD-10-CM | POA: Insufficient documentation

## 2018-04-18 DIAGNOSIS — Z87891 Personal history of nicotine dependence: Secondary | ICD-10-CM | POA: Insufficient documentation

## 2018-04-18 DIAGNOSIS — E871 Hypo-osmolality and hyponatremia: Secondary | ICD-10-CM | POA: Diagnosis not present

## 2018-04-18 DIAGNOSIS — I10 Essential (primary) hypertension: Secondary | ICD-10-CM | POA: Insufficient documentation

## 2018-04-18 DIAGNOSIS — Z8673 Personal history of transient ischemic attack (TIA), and cerebral infarction without residual deficits: Secondary | ICD-10-CM | POA: Insufficient documentation

## 2018-04-18 DIAGNOSIS — Z79899 Other long term (current) drug therapy: Secondary | ICD-10-CM | POA: Diagnosis not present

## 2018-04-18 LAB — BASIC METABOLIC PANEL
Anion gap: 7 (ref 5–15)
BUN: 13 mg/dL (ref 6–20)
CHLORIDE: 98 mmol/L — AB (ref 101–111)
CO2: 25 mmol/L (ref 22–32)
CREATININE: 0.68 mg/dL (ref 0.44–1.00)
Calcium: 9 mg/dL (ref 8.9–10.3)
GFR calc Af Amer: >60 mL/min (ref >60)
Glucose, Bld: 141 mg/dL — ABNORMAL HIGH (ref 65–99)
Potassium: 3.6 mmol/L (ref 3.5–5.1)
Sodium: 130 mmol/L — ABNORMAL LOW (ref 135–145)

## 2018-04-18 LAB — CBC
HCT: 38.4 % (ref 35.0–47.0)
Hemoglobin: 13.5 g/dL (ref 12.0–16.0)
MCH: 33.1 pg (ref 26.0–34.0)
MCHC: 35 g/dL (ref 32.0–36.0)
MCV: 94.5 fL (ref 80.0–100.0)
PLATELETS: 210 10*3/uL (ref 150–440)
RBC: 4.07 MIL/uL (ref 3.80–5.20)
RDW: 12.9 % (ref 11.5–14.5)
WBC: 5.8 10*3/uL (ref 3.6–11.0)

## 2018-04-18 LAB — URINALYSIS, COMPLETE (UACMP) WITH MICROSCOPIC
BILIRUBIN URINE: NEGATIVE
Bacteria, UA: NONE SEEN
Glucose, UA: NEGATIVE mg/dL
Hgb urine dipstick: NEGATIVE
Ketones, ur: NEGATIVE mg/dL
Nitrite: NEGATIVE
PH: 6 (ref 5.0–8.0)
Protein, ur: NEGATIVE mg/dL
SPECIFIC GRAVITY, URINE: 1.008 (ref 1.005–1.030)
Squamous Epithelial / LPF: NONE SEEN (ref 0–5)

## 2018-04-18 LAB — TROPONIN I: Troponin I: <0.03 ng/mL (ref <0.03)

## 2018-04-18 NOTE — ED Triage Notes (Signed)
Pt reports that her blood pressure has high today and she has been feeling blah. She states that she gets UTIs a lot and thinks that she may be getting another one.

## 2018-04-19 DIAGNOSIS — R030 Elevated blood-pressure reading, without diagnosis of hypertension: Secondary | ICD-10-CM | POA: Diagnosis not present

## 2018-04-19 DIAGNOSIS — E871 Hypo-osmolality and hyponatremia: Secondary | ICD-10-CM | POA: Diagnosis not present

## 2018-04-19 MED ORDER — SODIUM CHLORIDE 0.9 % IV BOLUS
1000.0000 mL | Freq: Once | INTRAVENOUS | Status: AC
  Administered 2018-04-19: 1000 mL via INTRAVENOUS

## 2018-04-19 NOTE — ED Provider Notes (Signed)
[facility] Emergency Department Provider Note    First MD Initiated Contact with Patient 04/19/18 0003     (approximate)  I have reviewed the triage vital signs and the nursing notes.   HISTORY  Chief Complaint Hypertension and Weakness   HPI Robin Fernandez is a 84 y.o. female with below list of chronic medical conditions including hypertension presents to the emergency department with elevated blood pressure noted at home tonight with the patient states her blood pressure exceeded 106 systolic.  Patient denies any chest pain no shortness of breath dizziness nausea or vomiting area patient states she subsequently went to a pharmacy had her blood pressure checked again at which point her blood pressure systolic was 269.  Patient states that she just felt "washed out and became concerned resulting her visit to the emergency department.  Patient has no complaints at present besides "feeling blah".  Patient's blood pressure on arrival 158/62.   Past Medical History:  Diagnosis Date  . GERD (gastroesophageal reflux disease)   . Glaucoma   . Vasovagal syncope     Patient Active Problem List   Diagnosis Date Noted  . TIA (transient ischemic attack) 11/19/2017  . Syncope 05/04/2017    Past Surgical History:  Procedure Laterality Date  . ABDOMINAL HYSTERECTOMY    . EYE SURGERY      Prior to Admission medications   Medication Sig Start Date End Date Taking? Authorizing Provider  aspirin EC 81 MG EC tablet Take 1 tablet (81 mg total) by mouth daily. Patient not taking: Reported on 11/19/2017 05/07/17   Loletha Grayer, MD  azelastine (ASTELIN) 0.1 % nasal spray Place 2 sprays into both nostrils 2 (two) times daily. 08/14/17   [provider]  dicyclomine (BENTYL) 10 MG capsule Take 1 capsule by mouth 2 (two) times daily. 10/17/17   [provider]  omeprazole (PRILOSEC) 20 MG capsule Take 1 capsule by mouth 2 (two) times daily. 10/17/17    [provider]  Travoprost, BAK Free, (TRAVATAN) 0.004 % SOLN ophthalmic solution Place 1 drop into the left eye at bedtime.    [provider]  vitamin B-12 (CYANOCOBALAMIN) 500 MCG tablet Take 1,000 mcg by mouth daily.    [provider]  vitamin C (ASCORBIC ACID) 500 MG tablet Take 500 mg by mouth daily.    [provider]    Allergies Salicylates; Alendronate; Amoxil [amoxicillin]; Azopt [brinzolamide]; Deltasone [prednisone]; Erythromycin; Lumigan [bimatoprost]; Meloxicam; Ofloxacin; Parafon forte dsc [chlorzoxazone]; and Prevacid [lansoprazole]  History reviewed. No pertinent family history.  Social History Social History   Tobacco Use  . Smoking status: Former Smoker    Types: Cigarettes  . Smokeless tobacco: Never Used  Substance Use Topics  . Alcohol use: No  . Drug use: No    Review of Systems Constitutional: No fever/chills Eyes: No visual changes. ENT: No sore throat. Cardiovascular: Denies chest pain. Respiratory: Denies shortness of breath. Gastrointestinal: No abdominal pain.  No nausea, no vomiting.  No diarrhea.  No constipation. Genitourinary: Negative for dysuria. Musculoskeletal: Negative for neck pain.  Negative for back pain. Integumentary: Negative for rash. Neurological: Negative for headaches, focal weakness or numbness.   ____________________________________________   PHYSICAL EXAM:  VITAL SIGNS: ED Triage Vitals  Enc Vitals Group     BP 04/18/18 2118 (!) 158/62     Pulse Rate 04/18/18 2118 76     Resp 04/18/18 2118 18     Temp 04/18/18 2118 98.8 F (37.1 C)  Temp Source 04/18/18 2118 Oral     SpO2 04/18/18 2118 96 %     Weight 04/18/18 2119 53.5 kg (118 lb)     Height 04/18/18 2119 1.473 m (4\' 10" )     Head Circumference --      Peak Flow --      Pain Score 04/18/18 2119 0     Pain Loc --      Pain Edu? --      Excl. in [place]? --     Constitutional: Alert and oriented. Well appearing and in no  acute distress. Eyes: Conjunctivae are normal. PERRL. EOMI. Head: Atraumatic. Mouth/Throat: Mucous membranes are moist.  Oropharynx non-erythematous. Neck: No stridor.  Cardiovascular: Normal rate, regular rhythm. Good peripheral circulation. Grossly normal heart sounds. Respiratory: Normal respiratory effort.  No retractions. Lungs CTAB. Gastrointestinal: Soft and nontender. No distention.  Musculoskeletal: No lower extremity tenderness nor edema. No gross deformities of extremities. Neurologic:  Normal speech and language. No gross focal neurologic deficits are appreciated.  Skin:  Skin is warm, dry and intact. No rash noted. Psychiatric: Mood and affect are normal. Speech and behavior are normal.  ____________________________________________   LABS (all labs ordered are listed, but only abnormal results are displayed)  Labs Reviewed  BASIC METABOLIC PANEL - Abnormal; Notable for the following components:      Result Value   Sodium 130 (*)    Chloride 98 (*)    Glucose, Bld 141 (*)    All other components within normal limits  URINALYSIS, COMPLETE (UACMP) WITH MICROSCOPIC - Abnormal; Notable for the following components:   Color, Urine STRAW (*)    APPearance CLEAR (*)    Leukocytes, UA TRACE (*)    All other components within normal limits  CBC  TROPONIN I  CBG MONITORING, ED   ____________________________________________  EKG  ED ECG REPORT I, [place] N Ivon Roedel, the attending physician, personally viewed and interpreted this ECG.   Date: 04/19/2018  EKG Time: 9:25 PM  Rate: 74  Rhythm: Normal sinus rhythm  Axis: Normal  Intervals: Normal  ST&T Change: None  _  Procedures   ____________________________________________   INITIAL IMPRESSION / ASSESSMENT AND PLAN / ED COURSE  As part of my medical decision making, I reviewed the following data within the electronic MEDICAL RECORD NUMBER  84 year old female presented with above-stated history and physical exam.   Laboratory data unremarkable with exception of a sodium 130.  Patient received 1 L IV normal saline.  No intervention done regarding the patient's blood pressure will recommend outpatient follow-up with primary care provider.  Suspect possible home blood pressure cuff malfunction as is the patient.  ____________________________________________  FINAL CLINICAL IMPRESSION(S) / ED DIAGNOSES  Final diagnoses:  Essential hypertension  Hyponatremia     MEDICATIONS GIVEN DURING THIS VISIT:  Medications  sodium chloride 0.9 % bolus 1,000 mL (1,000 mLs Intravenous New Bag/Given 04/19/18 0030)     ED Discharge Orders    None       Note:  This document was prepared using Dragon voice recognition software and may include unintentional dictation errors.    Gregor Hams, MD 04/19/18 [phone]

## 2018-04-19 NOTE — ED Notes (Signed)
Pt reports feeling weak and fatigued today and took blood pressure today and it was 144/76. SBP normally runs 130s. Pt also reports headache in the back right of her head that started today.

## 2018-04-20 ENCOUNTER — Other Ambulatory Visit: Payer: Self-pay

## 2018-04-20 ENCOUNTER — Emergency Department
Admission: EM | Admit: 2018-04-20 | Discharge: 2018-04-20 | Disposition: A | Discharge status: Home / Self Care | Payer: PPO | Attending: Emergency Medicine | Admitting: Emergency Medicine

## 2018-04-20 ENCOUNTER — Emergency Department: Payer: PPO

## 2018-04-20 ENCOUNTER — Encounter: Payer: Self-pay | Admitting: Emergency Medicine

## 2018-04-20 DIAGNOSIS — R531 Weakness: Secondary | ICD-10-CM | POA: Diagnosis not present

## 2018-04-20 DIAGNOSIS — M6281 Muscle weakness (generalized): Secondary | ICD-10-CM | POA: Diagnosis not present

## 2018-04-20 DIAGNOSIS — Z87891 Personal history of nicotine dependence: Secondary | ICD-10-CM | POA: Diagnosis not present

## 2018-04-20 DIAGNOSIS — Z79899 Other long term (current) drug therapy: Secondary | ICD-10-CM | POA: Diagnosis not present

## 2018-04-20 DIAGNOSIS — G459 Transient cerebral ischemic attack, unspecified: Secondary | ICD-10-CM | POA: Diagnosis not present

## 2018-04-20 DIAGNOSIS — Z8673 Personal history of transient ischemic attack (TIA), and cerebral infarction without residual deficits: Secondary | ICD-10-CM | POA: Insufficient documentation

## 2018-04-20 LAB — URINALYSIS, COMPLETE (UACMP) WITH MICROSCOPIC
BACTERIA UA: NONE SEEN
Bilirubin Urine: NEGATIVE
GLUCOSE, UA: NEGATIVE mg/dL
Hgb urine dipstick: NEGATIVE
KETONES UR: NEGATIVE mg/dL
Leukocytes, UA: NEGATIVE
Nitrite: NEGATIVE
Protein, ur: NEGATIVE mg/dL
SPECIFIC GRAVITY, URINE: 1.004 — AB (ref 1.005–1.030)
pH: 7 (ref 5.0–8.0)

## 2018-04-20 LAB — CBC
HEMATOCRIT: 42.3 % (ref 35.0–47.0)
HEMOGLOBIN: 14.5 g/dL (ref 12.0–16.0)
MCH: 32.6 pg (ref 26.0–34.0)
MCHC: 34.4 g/dL (ref 32.0–36.0)
MCV: 94.9 fL (ref 80.0–100.0)
Platelets: 221 10*3/uL (ref 150–440)
RBC: 4.46 MIL/uL (ref 3.80–5.20)
RDW: 12.9 % (ref 11.5–14.5)
WBC: 5.8 10*3/uL (ref 3.6–11.0)

## 2018-04-20 LAB — BASIC METABOLIC PANEL
ANION GAP: 6 (ref 5–15)
BUN: 14 mg/dL (ref 6–20)
CO2: 26 mmol/L (ref 22–32)
Calcium: 9.1 mg/dL (ref 8.9–10.3)
Chloride: 101 mmol/L (ref 101–111)
Creatinine, Ser: 0.72 mg/dL (ref 0.44–1.00)
GFR calc Af Amer: >60 mL/min (ref >60)
GLUCOSE: 112 mg/dL — AB (ref 65–99)
Potassium: 4.2 mmol/L (ref 3.5–5.1)
Sodium: 133 mmol/L — ABNORMAL LOW (ref 135–145)

## 2018-04-20 MED ORDER — SODIUM CHLORIDE 0.9 % IV BOLUS
500.0000 mL | Freq: Once | INTRAVENOUS | Status: AC
  Administered 2018-04-20: 500 mL via INTRAVENOUS

## 2018-04-20 NOTE — ED Triage Notes (Signed)
Pt to ED via POV stating that she was seen in the ED on Tuesday, Pt states that she was told that her sodium was low and she was given a bolus of saline. Pt followed up with her PCP yesterday and they increased her sodium tablet to TID. Today pt has felt very weak and states that she has "gone down hill". Pt is in NAD at this time.

## 2018-04-20 NOTE — ED Provider Notes (Signed)
[facility] Emergency Department Provider Note  ____________________________________________   I have reviewed the triage vital signs and the nursing notes. Where available I have reviewed prior notes and, if possible and indicated, outside hospital notes.    HISTORY  Chief Complaint Weakness    HPI Robin MANTHE is a 84 y.o. female with a history of depression, history of reflux disease, had what was reported to be possible TIA last December with normal MRI, states that she also has a history of low sodium although her sodium is been close to the normal range for some time she is on some degree of fluid restriction, patient presents today feeling "blah".  She has no particular complaint, but she states she does have less energy than she sometimes does.  She states these kinds of episodes come and go.  Her husband of 11 years died in 2023-06-19 of last year she is coming up on the anniversary and she is worried about the health care of her son.  She does sleep although she gets up in the night to urinate.  And she just feels generally "blah".  She cannot describe it any further.  She has a completely negative review of systems, no headache no chest pain no shortness of breath no stiff neck she has a chronic mild cough which is been there for years but is not worse and she does not feel short of breath with it, she denies any abdominal pain dysuria urinary frequency diarrhea melena bright red blood per rectum exertional symptoms vomiting, she states she is eating.  She has no SI or HI.  She was concerned that perhaps her sodium was low but that she is on sodium supplementation.     Past Medical History:  Diagnosis Date  . GERD (gastroesophageal reflux disease)   . Glaucoma   . Vasovagal syncope     Patient Active Problem List   Diagnosis Date Noted  . TIA (transient ischemic attack) 11/19/2017  . Syncope 05/04/2017    Past Surgical History:  Procedure Laterality  Date  . ABDOMINAL HYSTERECTOMY    . EYE SURGERY      Prior to Admission medications   Medication Sig Start Date End Date Taking? Authorizing Provider  aspirin EC 81 MG EC tablet Take 1 tablet (81 mg total) by mouth daily. Patient not taking: Reported on 11/19/2017 05/07/17   Loletha Grayer, MD  azelastine (ASTELIN) 0.1 % nasal spray Place 2 sprays into both nostrils 2 (two) times daily. 08/14/17   [provider]  dicyclomine (BENTYL) 10 MG capsule Take 1 capsule by mouth 2 (two) times daily. 10/17/17   [provider]  omeprazole (PRILOSEC) 20 MG capsule Take 1 capsule by mouth 2 (two) times daily. 10/17/17   [provider]  Travoprost, BAK Free, (TRAVATAN) 0.004 % SOLN ophthalmic solution Place 1 drop into the left eye at bedtime.    [provider]  vitamin B-12 (CYANOCOBALAMIN) 500 MCG tablet Take 1,000 mcg by mouth daily.    [provider]  vitamin C (ASCORBIC ACID) 500 MG tablet Take 500 mg by mouth daily.    [provider]    Allergies Salicylates; Alendronate; Amoxil [amoxicillin]; Azopt [brinzolamide]; Deltasone [prednisone]; Erythromycin; Lumigan [bimatoprost]; Meloxicam; Ofloxacin; Parafon forte dsc [chlorzoxazone]; and Prevacid [lansoprazole]  No family history on file.  Social History Social History   Tobacco Use  . Smoking status: Former Smoker    Types: Cigarettes  . Smokeless tobacco: Never Used  Substance  Use Topics  . Alcohol use: No  . Drug use: No    Review of Systems Constitutional: No fever/chills Eyes: No visual changes. ENT: No sore throat. No stiff neck no neck pain Cardiovascular: Denies chest pain. Respiratory: Denies shortness of breath. Gastrointestinal:   no vomiting.  No diarrhea.  No constipation. Genitourinary: Negative for dysuria. Musculoskeletal: Negative lower extremity swelling Skin: Negative for rash. Neurological: Negative for severe headaches, focal weakness or  numbness.   ____________________________________________   PHYSICAL EXAM:  VITAL SIGNS: ED Triage Vitals  Enc Vitals Group     BP 04/20/18 1407 (!) 156/67     Pulse Rate 04/20/18 1407 72     Resp 04/20/18 1407 16     Temp 04/20/18 1407 98.6 F (37 C)     Temp Source 04/20/18 1407 Oral     SpO2 04/20/18 1407 96 %     Weight 04/20/18 1406 118 lb (53.5 kg)     Height --      Head Circumference --      Peak Flow --      Pain Score 04/20/18 1406 0     Pain Loc --      Pain Edu? --      Excl. in [place]? --     Constitutional: Alert and oriented. Well appearing and in no acute distress. Eyes: Conjunctivae are normal Head: Atraumatic HEENT: No congestion/rhinnorhea. Mucous membranes are moist.  Oropharynx non-erythematous Neck:   Nontender with no meningismus, no masses, no stridor Cardiovascular: Normal rate, regular rhythm. Grossly normal heart sounds.  Good peripheral circulation. Respiratory: Normal respiratory effort.  No retractions. Lungs CTAB. Abdominal: Soft and nontender. No distention. No guarding no rebound Back:  There is no focal tenderness or step off.  there is no midline tenderness there are no lesions noted. there is no CVA tenderness  Musculoskeletal: No lower extremity tenderness, no upper extremity tenderness. No joint effusions, no DVT signs strong distal pulses no edema Neurologic:  Normal speech and language. No gross focal neurologic deficits are appreciated.  Skin:  Skin is warm, dry and intact. No rash noted. Psychiatric: Mood and affect are normal. Speech and behavior are normal.  ____________________________________________   LABS (all labs ordered are listed, but only abnormal results are displayed)  Labs Reviewed  BASIC METABOLIC PANEL - Abnormal; Notable for the following components:      Result Value   Sodium 133 (*)    Glucose, Bld 112 (*)    All other components within normal limits  URINALYSIS, COMPLETE (UACMP) WITH MICROSCOPIC - Abnormal;  Notable for the following components:   Color, Urine STRAW (*)    APPearance CLEAR (*)    Specific Gravity, Urine 1.004 (*)    All other components within normal limits  CBC  CBG MONITORING, ED    Pertinent labs  results that were available during my care of the patient were reviewed by me and considered in my medical decision making (see chart for details). ____________________________________________  EKG  I personally interpreted any EKGs ordered by me or triage Sinus rhythm rate 69 bpm no acute ST elevation or depression normal axis nonspecific ST changes ____________________________________________  RADIOLOGY  Pertinent labs & imaging results that were available during my care of the patient were reviewed by me and considered in my medical decision making (see chart for details). If possible, patient and/or family made aware of any abnormal findings.  No results found. ____________________________________________    PROCEDURES  Procedure(s) performed: None  Procedures  Critical Care performed: None  ____________________________________________   INITIAL IMPRESSION / ASSESSMENT AND PLAN / ED COURSE  Pertinent labs & imaging results that were available during my care of the patient were reviewed by me and considered in my medical decision making (see chart for details).  Patient very well-appearing no evidence of UTI no evidence of TIA CVA no chest pain or shortness of breath abdomen is benign EKG is reassuring neurologic exam is normal, very reassuring work-up.  We will obtain a chest x-ray and given the increased heat over the last couple days we will give her a small bolus of fluid, but there could be some element of depression to this although she has no SI or HI, she states she has a slight chronic cough for "years" we will do a chest x-ray but no evidence of pneumonia chronically sats are in the high 90s and I am in the room 98, we will continue to observe the  patient, patient in no acute distress.      ____________________________________________   FINAL CLINICAL IMPRESSION(S) / ED DIAGNOSES  Final diagnoses:  None      This chart was dictated using voice recognition software.  Despite best efforts to proofread,  errors can occur which can change meaning.      Schuyler Amor, MD 04/20/18 [phone]

## 2018-04-20 NOTE — Discharge Instructions (Signed)
Your work-up here, including sodium is very reassuring today.  Please continue to eat and drink as directed by her doctor.  I know they are limiting fluids because of your low sodium, please follow those directions from your doctor.  Return to the emergency room for any new or worrisome symptoms including chest pain shortness of breath nausea vomiting headache weakness numbness, diarrhea pain BP fever, abdominal pain refill worse in any way.

## 2018-04-26 DIAGNOSIS — E871 Hypo-osmolality and hyponatremia: Secondary | ICD-10-CM | POA: Diagnosis not present

## 2018-04-30 DIAGNOSIS — M48062 Spinal stenosis, lumbar region with neurogenic claudication: Secondary | ICD-10-CM | POA: Diagnosis not present

## 2018-04-30 DIAGNOSIS — E871 Hypo-osmolality and hyponatremia: Secondary | ICD-10-CM | POA: Diagnosis not present

## 2018-06-07 DIAGNOSIS — E871 Hypo-osmolality and hyponatremia: Secondary | ICD-10-CM | POA: Diagnosis not present

## 2018-06-07 DIAGNOSIS — R300 Dysuria: Secondary | ICD-10-CM | POA: Diagnosis not present

## 2018-06-14 DIAGNOSIS — E871 Hypo-osmolality and hyponatremia: Secondary | ICD-10-CM | POA: Diagnosis not present

## 2018-06-14 DIAGNOSIS — H6122 Impacted cerumen, left ear: Secondary | ICD-10-CM | POA: Diagnosis not present

## 2018-07-25 DIAGNOSIS — R300 Dysuria: Secondary | ICD-10-CM | POA: Diagnosis not present

## 2018-07-27 DIAGNOSIS — E538 Deficiency of other specified B group vitamins: Secondary | ICD-10-CM | POA: Diagnosis not present

## 2018-07-27 DIAGNOSIS — R5383 Other fatigue: Secondary | ICD-10-CM | POA: Diagnosis not present

## 2018-07-27 DIAGNOSIS — E871 Hypo-osmolality and hyponatremia: Secondary | ICD-10-CM | POA: Diagnosis not present

## 2018-07-27 DIAGNOSIS — R531 Weakness: Secondary | ICD-10-CM | POA: Diagnosis not present

## 2018-08-06 DIAGNOSIS — E039 Hypothyroidism, unspecified: Secondary | ICD-10-CM | POA: Diagnosis not present

## 2018-08-06 DIAGNOSIS — E538 Deficiency of other specified B group vitamins: Secondary | ICD-10-CM | POA: Diagnosis not present

## 2018-08-06 DIAGNOSIS — Z79899 Other long term (current) drug therapy: Secondary | ICD-10-CM | POA: Diagnosis not present

## 2018-08-13 DIAGNOSIS — M48061 Spinal stenosis, lumbar region without neurogenic claudication: Secondary | ICD-10-CM | POA: Diagnosis not present

## 2018-08-13 DIAGNOSIS — E039 Hypothyroidism, unspecified: Secondary | ICD-10-CM | POA: Diagnosis not present

## 2018-08-13 DIAGNOSIS — Z79899 Other long term (current) drug therapy: Secondary | ICD-10-CM | POA: Diagnosis not present

## 2018-08-13 DIAGNOSIS — K219 Gastro-esophageal reflux disease without esophagitis: Secondary | ICD-10-CM | POA: Diagnosis not present

## 2018-08-13 DIAGNOSIS — E538 Deficiency of other specified B group vitamins: Secondary | ICD-10-CM | POA: Diagnosis not present

## 2018-08-15 DIAGNOSIS — D485 Neoplasm of uncertain behavior of skin: Secondary | ICD-10-CM | POA: Diagnosis not present

## 2018-08-15 DIAGNOSIS — C44622 Squamous cell carcinoma of skin of right upper limb, including shoulder: Secondary | ICD-10-CM | POA: Diagnosis not present

## 2018-08-29 DIAGNOSIS — C44622 Squamous cell carcinoma of skin of right upper limb, including shoulder: Secondary | ICD-10-CM | POA: Diagnosis not present

## 2018-09-24 DIAGNOSIS — H40052 Ocular hypertension, left eye: Secondary | ICD-10-CM | POA: Diagnosis not present

## 2019-01-08 DIAGNOSIS — L400 Psoriasis vulgaris: Secondary | ICD-10-CM | POA: Diagnosis not present

## 2019-01-08 DIAGNOSIS — Z08 Encounter for follow-up examination after completed treatment for malignant neoplasm: Secondary | ICD-10-CM | POA: Diagnosis not present

## 2019-01-08 DIAGNOSIS — L570 Actinic keratosis: Secondary | ICD-10-CM | POA: Diagnosis not present

## 2019-01-08 DIAGNOSIS — D485 Neoplasm of uncertain behavior of skin: Secondary | ICD-10-CM | POA: Diagnosis not present

## 2019-01-08 DIAGNOSIS — Z85828 Personal history of other malignant neoplasm of skin: Secondary | ICD-10-CM | POA: Diagnosis not present

## 2019-01-08 DIAGNOSIS — D045 Carcinoma in situ of skin of trunk: Secondary | ICD-10-CM | POA: Diagnosis not present

## 2019-01-09 DIAGNOSIS — M1712 Unilateral primary osteoarthritis, left knee: Secondary | ICD-10-CM | POA: Diagnosis not present

## 2019-01-09 DIAGNOSIS — M25562 Pain in left knee: Secondary | ICD-10-CM | POA: Diagnosis not present

## 2019-01-29 DIAGNOSIS — M1712 Unilateral primary osteoarthritis, left knee: Secondary | ICD-10-CM | POA: Diagnosis not present

## 2019-01-30 ENCOUNTER — Emergency Department: Payer: PPO

## 2019-01-30 ENCOUNTER — Other Ambulatory Visit: Payer: Self-pay

## 2019-01-30 ENCOUNTER — Emergency Department
Admission: EM | Admit: 2019-01-30 | Discharge: 2019-01-30 | Disposition: A | Discharge status: Home / Self Care | Payer: PPO | Attending: Student in an Organized Health Care Education/Training Program | Admitting: Student in an Organized Health Care Education/Training Program

## 2019-01-30 DIAGNOSIS — R1013 Epigastric pain: Secondary | ICD-10-CM | POA: Diagnosis not present

## 2019-01-30 DIAGNOSIS — R110 Nausea: Secondary | ICD-10-CM | POA: Diagnosis not present

## 2019-01-30 DIAGNOSIS — R531 Weakness: Secondary | ICD-10-CM | POA: Diagnosis not present

## 2019-01-30 DIAGNOSIS — Z7982 Long term (current) use of aspirin: Secondary | ICD-10-CM | POA: Insufficient documentation

## 2019-01-30 DIAGNOSIS — I10 Essential (primary) hypertension: Secondary | ICD-10-CM | POA: Diagnosis not present

## 2019-01-30 DIAGNOSIS — Z79899 Other long term (current) drug therapy: Secondary | ICD-10-CM | POA: Diagnosis not present

## 2019-01-30 DIAGNOSIS — R55 Syncope and collapse: Secondary | ICD-10-CM | POA: Insufficient documentation

## 2019-01-30 DIAGNOSIS — K5730 Diverticulosis of large intestine without perforation or abscess without bleeding: Secondary | ICD-10-CM | POA: Diagnosis not present

## 2019-01-30 DIAGNOSIS — Z87891 Personal history of nicotine dependence: Secondary | ICD-10-CM | POA: Insufficient documentation

## 2019-01-30 LAB — URINALYSIS, COMPLETE (UACMP) WITH MICROSCOPIC
Bacteria, UA: NONE SEEN
Bilirubin Urine: NEGATIVE
Glucose, UA: 50 mg/dL — AB
Hgb urine dipstick: NEGATIVE
Ketones, ur: 5 mg/dL — AB
Leukocytes,Ua: NEGATIVE
Nitrite: NEGATIVE
Protein, ur: 30 mg/dL — AB
Specific Gravity, Urine: 1.024 (ref 1.005–1.030)
pH: 5 (ref 5.0–8.0)

## 2019-01-30 LAB — TROPONIN I
Troponin I: <0.03 ng/mL (ref <0.03)
Troponin I: <0.03 ng/mL (ref <0.03)

## 2019-01-30 LAB — COMPREHENSIVE METABOLIC PANEL
ALT: 18 U/L (ref 0–44)
AST: 24 U/L (ref 15–41)
Albumin: 3.6 g/dL (ref 3.5–5.0)
Alkaline Phosphatase: 89 U/L (ref 38–126)
Anion gap: 10 (ref 5–15)
BILIRUBIN TOTAL: 0.5 mg/dL (ref 0.3–1.2)
BUN: 18 mg/dL (ref 8–23)
CALCIUM: 8.4 mg/dL — AB (ref 8.9–10.3)
CO2: 24 mmol/L (ref 22–32)
CREATININE: 0.69 mg/dL (ref 0.44–1.00)
Chloride: 99 mmol/L (ref 98–111)
Glucose, Bld: 153 mg/dL — ABNORMAL HIGH (ref 70–99)
Potassium: 3.5 mmol/L (ref 3.5–5.1)
Sodium: 133 mmol/L — ABNORMAL LOW (ref 135–145)
TOTAL PROTEIN: 6.2 g/dL — AB (ref 6.5–8.1)

## 2019-01-30 LAB — CBC WITH DIFFERENTIAL/PLATELET
ABS IMMATURE GRANULOCYTES: 0.01 10*3/uL (ref 0.00–0.07)
BASOS PCT: 0 %
Basophils Absolute: 0 10*3/uL (ref 0.0–0.1)
Eosinophils Absolute: 0 10*3/uL (ref 0.0–0.5)
Eosinophils Relative: 0 %
HEMATOCRIT: 40.4 % (ref 36.0–46.0)
Hemoglobin: 13.6 g/dL (ref 12.0–15.0)
IMMATURE GRANULOCYTES: 0 %
LYMPHS ABS: 0.9 10*3/uL (ref 0.7–4.0)
LYMPHS PCT: 19 %
MCH: 31.9 pg (ref 26.0–34.0)
MCHC: 33.7 g/dL (ref 30.0–36.0)
MCV: 94.8 fL (ref 80.0–100.0)
MONOS PCT: 13 %
Monocytes Absolute: 0.6 10*3/uL (ref 0.1–1.0)
NEUTROS ABS: 3.1 10*3/uL (ref 1.7–7.7)
NEUTROS PCT: 68 %
Platelets: 182 10*3/uL (ref 150–400)
RBC: 4.26 MIL/uL (ref 3.87–5.11)
RDW: 12.6 % (ref 11.5–15.5)
WBC: 4.6 10*3/uL (ref 4.0–10.5)
nRBC: 0 % (ref 0.0–0.2)

## 2019-01-30 LAB — LIPASE, BLOOD: Lipase: 34 U/L (ref 11–51)

## 2019-01-30 MED ORDER — SODIUM CHLORIDE 0.9 % IV BOLUS
500.0000 mL | Freq: Once | INTRAVENOUS | Status: AC
  Administered 2019-01-30: 500 mL via INTRAVENOUS

## 2019-01-30 MED ORDER — IOHEXOL 300 MG/ML  SOLN
75.0000 mL | Freq: Once | INTRAMUSCULAR | Status: AC | PRN
  Administered 2019-01-30: 75 mL via INTRAVENOUS

## 2019-01-30 MED ORDER — ONDANSETRON HCL 4 MG/2ML IJ SOLN
4.0000 mg | Freq: Once | INTRAMUSCULAR | Status: AC
  Administered 2019-01-30: 4 mg via INTRAVENOUS
  Filled 2019-01-30: qty 2

## 2019-01-30 MED ORDER — ACETAMINOPHEN 500 MG PO TABS
1000.0000 mg | ORAL_TABLET | Freq: Once | ORAL | Status: AC
  Administered 2019-01-30: 1000 mg via ORAL
  Filled 2019-01-30: qty 2

## 2019-01-30 NOTE — ED Triage Notes (Signed)
PT to ED via EMS from getting hair done where pt has near syncopal episode. Pt daughter says eyes rolled back in head for approx 2 seconds. NO fall, no total LOC, 1 baby aspirin daily. Pt c/o nausea and belching since episode. Pt AO at this time

## 2019-01-30 NOTE — ED Provider Notes (Signed)
[facility] Emergency Department Provider Note    First MD Initiated Contact with Patient 01/30/19 1607     (approximate)  I have reviewed the triage vital signs and the nursing notes.   HISTORY  Chief Complaint Near Syncope    HPI Robin Fernandez is a 85 y.o. female resents the ER for evaluation of syncopal episode that occurred while at the hairdresser today.  Says that she has been feeling nauseated and weak for the past day or 2.  Has had normal oral intake.  Denies any chest pain or palpitations.  States that the episode was very brief.  There was no postictal period.  There is no reported seizure-like activity.  Does have history of vasovagal syncope and has been feeling nauseated since.   Past Medical History:  Diagnosis Date  . GERD (gastroesophageal reflux disease)   . Glaucoma   . Vasovagal syncope    History reviewed. No pertinent family history. Past Surgical History:  Procedure Laterality Date  . ABDOMINAL HYSTERECTOMY    . EYE SURGERY     Patient Active Problem List   Diagnosis Date Noted  . TIA (transient ischemic attack) 11/19/2017  . Syncope 05/04/2017      Prior to Admission medications   Medication Sig Start Date End Date Taking? Authorizing Provider  aspirin EC 81 MG EC tablet Take 1 tablet (81 mg total) by mouth daily. 05/07/17  Yes Wieting, Richard, MD  Cranberry 400 MG CAPS Take 800 mg by mouth 2 (two) times daily.   Yes [provider]  diclofenac sodium (VOLTAREN) 1 % GEL Apply 4 g topically 4 (four) times daily. 01/29/19  Yes [provider]  fluticasone (FLONASE) 50 MCG/ACT nasal spray Place 2 sprays into both nostrils daily. 07/11/18  Yes [provider]  omeprazole (PRILOSEC) 20 MG capsule Take 1 capsule by mouth 2 (two) times daily. 10/17/17  Yes [provider]  sodium chloride 1 g tablet Take 1 g by mouth 3 (three) times daily. 04/19/18  Yes [provider]  Travoprost,  BAK Free, (TRAVATAN) 0.004 % SOLN ophthalmic solution Place 1 drop into the left eye at bedtime.   Yes [provider]  vitamin B-12 (CYANOCOBALAMIN) 500 MCG tablet Take 1,000 mcg by mouth daily.   Yes [provider]  vitamin C (ASCORBIC ACID) 500 MG tablet Take 500 mg by mouth daily.   Yes [provider]  azelastine (ASTELIN) 0.1 % nasal spray Place 2 sprays into both nostrils 2 (two) times daily. 08/14/17   [provider]    Allergies Salicylates; Alendronate; Amoxil [amoxicillin]; Azopt [brinzolamide]; Deltasone [prednisone]; Erythromycin; Lumigan [bimatoprost]; Meloxicam; Ofloxacin; Parafon forte dsc [chlorzoxazone]; and Prevacid [lansoprazole]    Social History Social History   Tobacco Use  . Smoking status: Former Smoker    Types: Cigarettes  . Smokeless tobacco: Never Used  Substance Use Topics  . Alcohol use: No  . Drug use: No    Review of Systems Patient denies headaches, rhinorrhea, blurry vision, numbness, shortness of breath, chest pain, edema, cough, abdominal pain, nausea, vomiting, diarrhea, dysuria, fevers, rashes or hallucinations unless otherwise stated above in HPI. ____________________________________________   PHYSICAL EXAM:  VITAL SIGNS: Vitals:   01/30/19 1756 01/30/19 1945  BP: (!) 160/68 (!) 150/68  Pulse: 77 73  Resp: 17 (!) 21  Temp:    SpO2: 97% 93%    Constitutional: Alert and oriented.  Eyes: Conjunctivae are normal.  Head: Atraumatic. Nose: No congestion/rhinnorhea. Mouth/Throat:  Mucous membranes are moist.   Neck: No stridor. Painless ROM.  Cardiovascular: Normal rate, regular rhythm. Grossly normal heart sounds.  Good peripheral circulation. Respiratory: Normal respiratory effort.  No retractions. Lungs CTAB. Gastrointestinal: Soft and nontender. No distention. No abdominal bruits. No CVA tenderness. Genitourinary:  Musculoskeletal: No lower extremity tenderness nor edema.  No joint  effusions. Neurologic:  Normal speech and language. No gross focal neurologic deficits are appreciated. No facial droop Skin:  Skin is warm, dry and intact. No rash noted. Psychiatric: Mood and affect are normal. Speech and behavior are normal.  ____________________________________________   LABS (all labs ordered are listed, but only abnormal results are displayed)  Results for orders placed or performed during the hospital encounter of 01/30/19 (from the past 24 hour(s))  CBC with Differential/Platelet     Status: None   Collection Time: 01/30/19  4:23 PM  Result Value Ref Range   WBC 4.6 4.0 - 10.5 K/uL   RBC 4.26 3.87 - 5.11 MIL/uL   Hemoglobin 13.6 12.0 - 15.0 g/dL   HCT 40.4 36.0 - 46.0 %   MCV 94.8 80.0 - 100.0 fL   MCH 31.9 26.0 - 34.0 pg   MCHC 33.7 30.0 - 36.0 g/dL   RDW 12.6 11.5 - 15.5 %   Platelets 182 150 - 400 K/uL   nRBC 0.0 0.0 - 0.2 %   Neutrophils Relative % 68 %   Neutro Abs 3.1 1.7 - 7.7 K/uL   Lymphocytes Relative 19 %   Lymphs Abs 0.9 0.7 - 4.0 K/uL   Monocytes Relative 13 %   Monocytes Absolute 0.6 0.1 - 1.0 K/uL   Eosinophils Relative 0 %   Eosinophils Absolute 0.0 0.0 - 0.5 K/uL   Basophils Relative 0 %   Basophils Absolute 0.0 0.0 - 0.1 K/uL   Immature Granulocytes 0 %   Abs Immature Granulocytes 0.01 0.00 - 0.07 K/uL  Comprehensive metabolic panel     Status: Abnormal   Collection Time: 01/30/19  4:23 PM  Result Value Ref Range   Sodium 133 (L) 135 - 145 mmol/L   Potassium 3.5 3.5 - 5.1 mmol/L   Chloride 99 98 - 111 mmol/L   CO2 24 22 - 32 mmol/L   Glucose, Bld 153 (H) 70 - 99 mg/dL   BUN 18 8 - 23 mg/dL   Creatinine, Ser 0.69 0.44 - 1.00 mg/dL   Calcium 8.4 (L) 8.9 - 10.3 mg/dL   Total Protein 6.2 (L) 6.5 - 8.1 g/dL   Albumin 3.6 3.5 - 5.0 g/dL   AST 24 15 - 41 U/L   ALT 18 0 - 44 U/L   Alkaline Phosphatase 89 38 - 126 U/L   Total Bilirubin 0.5 0.3 - 1.2 mg/dL   GFR calc non Af Amer >60 >60 mL/min   GFR calc Af Amer >60 >60 mL/min    Anion gap 10 5 - 15  Lipase, blood     Status: None   Collection Time: 01/30/19  4:23 PM  Result Value Ref Range   Lipase 34 11 - 51 U/L  Urinalysis, Complete w Microscopic     Status: Abnormal   Collection Time: 01/30/19  4:23 PM  Result Value Ref Range   Color, Urine YELLOW (A) YELLOW   APPearance CLEAR (A) CLEAR   Specific Gravity, Urine 1.024 1.005 - 1.030   pH 5.0 5.0 - 8.0   Glucose, UA 50 (A) NEGATIVE mg/dL   Hgb urine dipstick NEGATIVE NEGATIVE   Bilirubin  Urine NEGATIVE NEGATIVE   Ketones, ur 5 (A) NEGATIVE mg/dL   Protein, ur 30 (A) NEGATIVE mg/dL   Nitrite NEGATIVE NEGATIVE   Leukocytes,Ua NEGATIVE NEGATIVE   RBC / HPF 0-5 0 - 5 RBC/hpf   WBC, UA 0-5 0 - 5 WBC/hpf   Bacteria, UA NONE SEEN NONE SEEN   Squamous Epithelial / LPF 0-5 0 - 5   Mucus PRESENT    Hyaline Casts, UA PRESENT   Troponin I - ONCE - STAT     Status: None   Collection Time: 01/30/19  4:23 PM  Result Value Ref Range   Troponin I <0.03 <0.03 ng/mL  Troponin I - Once-Timed     Status: None   Collection Time: 01/30/19  7:25 PM  Result Value Ref Range   Troponin I <0.03 <0.03 ng/mL   ____________________________________________  EKG My review and personal interpretation at Time: 16:12   Indication: syncope  Rate: 85  Rhythm: sinus Axis: normal Other: normal intervals, nonspecific st abn, no stemi ____________________________________________  RADIOLOGY   ____________________________________________   PROCEDURES  Procedure(s) performed:  Procedures    Critical Care performed: no ____________________________________________   INITIAL IMPRESSION / ASSESSMENT AND PLAN / ED COURSE  Pertinent labs & imaging results that were available during my care of the patient were reviewed by me and considered in my medical decision making (see chart for details).   DDX: Vasovagal, dehydration, electrolyte abnormality, seizure, anemia  Robin Fernandez is a 85 y.o. who presents to the ED with  episode of syncope as described above.  She is currently well-appearing.  Is having some nausea and belching with some mild epigastric tenderness therefore will order CT imaging to further evaluate.  EKG does not show any evidence of dysrhythmia or ischemia but will further stratify with serial enzymes.  Will place patient on cardiac monitor.  Her neuro exam is nonfocal.  Does not sound consistent with seizure-like activity.  Denies any headache or discomfort at this time.  Clinical Course as of Jan 29 2034  Wed Jan 30, 2019  1829 CT imaging is reassuring.   [PR]  1956 Repeat troponin is negative.  Remains hemodynamically stable.  Requesting discharge home.   [PR]    Clinical Course User Index [PR] Merlyn Lot, MD     As part of my medical decision making, I reviewed the following data within the [place] notes reviewed and incorporated, Labs reviewed, notes from prior ED visits and [place] Controlled Substance Database   ____________________________________________   FINAL CLINICAL IMPRESSION(S) / ED DIAGNOSES  Final diagnoses:  Syncope, unspecified syncope type      NEW MEDICATIONS STARTED DURING THIS VISIT:  New Prescriptions   No medications on file     Note:  This document was prepared using Dragon voice recognition software and may include unintentional dictation errors.    Merlyn Lot, MD 01/30/19 2037

## 2019-01-30 NOTE — ED Notes (Signed)
PT in CT at this time

## 2019-03-19 DIAGNOSIS — Z79899 Other long term (current) drug therapy: Secondary | ICD-10-CM | POA: Diagnosis not present

## 2019-03-19 DIAGNOSIS — E039 Hypothyroidism, unspecified: Secondary | ICD-10-CM | POA: Diagnosis not present

## 2019-03-19 DIAGNOSIS — E538 Deficiency of other specified B group vitamins: Secondary | ICD-10-CM | POA: Diagnosis not present

## 2019-04-25 DIAGNOSIS — D045 Carcinoma in situ of skin of trunk: Secondary | ICD-10-CM | POA: Diagnosis not present

## 2019-04-25 DIAGNOSIS — L570 Actinic keratosis: Secondary | ICD-10-CM | POA: Diagnosis not present

## 2019-05-01 DIAGNOSIS — L718 Other rosacea: Secondary | ICD-10-CM | POA: Diagnosis not present

## 2019-05-03 DIAGNOSIS — H40052 Ocular hypertension, left eye: Secondary | ICD-10-CM | POA: Diagnosis not present

## 2019-05-06 DIAGNOSIS — E039 Hypothyroidism, unspecified: Secondary | ICD-10-CM | POA: Diagnosis not present

## 2019-05-06 DIAGNOSIS — H6122 Impacted cerumen, left ear: Secondary | ICD-10-CM | POA: Diagnosis not present

## 2019-05-06 DIAGNOSIS — E871 Hypo-osmolality and hyponatremia: Secondary | ICD-10-CM | POA: Diagnosis not present

## 2019-05-06 DIAGNOSIS — M170 Bilateral primary osteoarthritis of knee: Secondary | ICD-10-CM | POA: Diagnosis not present

## 2019-05-06 DIAGNOSIS — E538 Deficiency of other specified B group vitamins: Secondary | ICD-10-CM | POA: Diagnosis not present

## 2019-07-02 DIAGNOSIS — M25562 Pain in left knee: Secondary | ICD-10-CM | POA: Diagnosis not present

## 2019-07-02 DIAGNOSIS — G8929 Other chronic pain: Secondary | ICD-10-CM | POA: Diagnosis not present

## 2019-07-02 DIAGNOSIS — M25561 Pain in right knee: Secondary | ICD-10-CM | POA: Diagnosis not present

## 2019-07-02 DIAGNOSIS — M170 Bilateral primary osteoarthritis of knee: Secondary | ICD-10-CM | POA: Diagnosis not present

## 2019-07-24 DIAGNOSIS — H169 Unspecified keratitis: Secondary | ICD-10-CM | POA: Diagnosis not present

## 2019-11-03 ENCOUNTER — Emergency Department: Payer: PPO

## 2019-11-03 ENCOUNTER — Encounter: Payer: Self-pay | Admitting: Emergency Medicine

## 2019-11-03 ENCOUNTER — Other Ambulatory Visit: Payer: Self-pay

## 2019-11-03 ENCOUNTER — Inpatient Hospital Stay
Admission: EM | Admit: 2019-11-03 | Discharge: 2019-11-06 | DRG: 064 | Disposition: A | Discharge status: Home Health | Payer: PPO | Attending: Internal Medicine | Admitting: Internal Medicine

## 2019-11-03 DIAGNOSIS — G92 Toxic encephalopathy: Secondary | ICD-10-CM | POA: Diagnosis present

## 2019-11-03 DIAGNOSIS — I472 Ventricular tachycardia: Secondary | ICD-10-CM | POA: Diagnosis not present

## 2019-11-03 DIAGNOSIS — I160 Hypertensive urgency: Secondary | ICD-10-CM | POA: Diagnosis present

## 2019-11-03 DIAGNOSIS — E1165 Type 2 diabetes mellitus with hyperglycemia: Secondary | ICD-10-CM | POA: Diagnosis not present

## 2019-11-03 DIAGNOSIS — Z20828 Contact with and (suspected) exposure to other viral communicable diseases: Secondary | ICD-10-CM | POA: Diagnosis not present

## 2019-11-03 DIAGNOSIS — E876 Hypokalemia: Secondary | ICD-10-CM | POA: Diagnosis not present

## 2019-11-03 DIAGNOSIS — R410 Disorientation, unspecified: Secondary | ICD-10-CM | POA: Diagnosis not present

## 2019-11-03 DIAGNOSIS — Z8673 Personal history of transient ischemic attack (TIA), and cerebral infarction without residual deficits: Secondary | ICD-10-CM | POA: Diagnosis not present

## 2019-11-03 DIAGNOSIS — R55 Syncope and collapse: Secondary | ICD-10-CM

## 2019-11-03 DIAGNOSIS — G8194 Hemiplegia, unspecified affecting left nondominant side: Secondary | ICD-10-CM | POA: Diagnosis not present

## 2019-11-03 DIAGNOSIS — I6381 Other cerebral infarction due to occlusion or stenosis of small artery: Principal | ICD-10-CM | POA: Diagnosis present

## 2019-11-03 DIAGNOSIS — R778 Other specified abnormalities of plasma proteins: Secondary | ICD-10-CM

## 2019-11-03 DIAGNOSIS — Z888 Allergy status to other drugs, medicaments and biological substances status: Secondary | ICD-10-CM | POA: Diagnosis not present

## 2019-11-03 DIAGNOSIS — I739 Peripheral vascular disease, unspecified: Secondary | ICD-10-CM | POA: Diagnosis present

## 2019-11-03 DIAGNOSIS — K219 Gastro-esophageal reflux disease without esophagitis: Secondary | ICD-10-CM | POA: Diagnosis not present

## 2019-11-03 DIAGNOSIS — W19XXXA Unspecified fall, initial encounter: Secondary | ICD-10-CM | POA: Diagnosis not present

## 2019-11-03 DIAGNOSIS — I6523 Occlusion and stenosis of bilateral carotid arteries: Secondary | ICD-10-CM | POA: Diagnosis not present

## 2019-11-03 DIAGNOSIS — E871 Hypo-osmolality and hyponatremia: Secondary | ICD-10-CM | POA: Diagnosis not present

## 2019-11-03 DIAGNOSIS — R531 Weakness: Secondary | ICD-10-CM

## 2019-11-03 DIAGNOSIS — R29702 NIHSS score 2: Secondary | ICD-10-CM | POA: Diagnosis present

## 2019-11-03 DIAGNOSIS — I639 Cerebral infarction, unspecified: Secondary | ICD-10-CM | POA: Diagnosis not present

## 2019-11-03 DIAGNOSIS — I10 Essential (primary) hypertension: Secondary | ICD-10-CM | POA: Diagnosis present

## 2019-11-03 DIAGNOSIS — Z79899 Other long term (current) drug therapy: Secondary | ICD-10-CM | POA: Diagnosis not present

## 2019-11-03 DIAGNOSIS — Z87891 Personal history of nicotine dependence: Secondary | ICD-10-CM | POA: Diagnosis not present

## 2019-11-03 DIAGNOSIS — Z90710 Acquired absence of both cervix and uterus: Secondary | ICD-10-CM | POA: Diagnosis not present

## 2019-11-03 DIAGNOSIS — I63531 Cerebral infarction due to unspecified occlusion or stenosis of right posterior cerebral artery: Secondary | ICD-10-CM | POA: Diagnosis not present

## 2019-11-03 DIAGNOSIS — H409 Unspecified glaucoma: Secondary | ICD-10-CM | POA: Diagnosis present

## 2019-11-03 DIAGNOSIS — R4182 Altered mental status, unspecified: Secondary | ICD-10-CM | POA: Diagnosis present

## 2019-11-03 DIAGNOSIS — R29810 Facial weakness: Secondary | ICD-10-CM | POA: Diagnosis not present

## 2019-11-03 DIAGNOSIS — Z886 Allergy status to analgesic agent status: Secondary | ICD-10-CM | POA: Diagnosis not present

## 2019-11-03 DIAGNOSIS — G459 Transient cerebral ischemic attack, unspecified: Secondary | ICD-10-CM

## 2019-11-03 DIAGNOSIS — R404 Transient alteration of awareness: Secondary | ICD-10-CM | POA: Diagnosis not present

## 2019-11-03 DIAGNOSIS — Z7982 Long term (current) use of aspirin: Secondary | ICD-10-CM

## 2019-11-03 DIAGNOSIS — R7989 Other specified abnormal findings of blood chemistry: Secondary | ICD-10-CM | POA: Diagnosis not present

## 2019-11-03 DIAGNOSIS — R0902 Hypoxemia: Secondary | ICD-10-CM | POA: Diagnosis not present

## 2019-11-03 DIAGNOSIS — R42 Dizziness and giddiness: Secondary | ICD-10-CM | POA: Diagnosis not present

## 2019-11-03 DIAGNOSIS — R29898 Other symptoms and signs involving the musculoskeletal system: Secondary | ICD-10-CM | POA: Diagnosis not present

## 2019-11-03 LAB — URINALYSIS, COMPLETE (UACMP) WITH MICROSCOPIC
Bacteria, UA: NONE SEEN
Bilirubin Urine: NEGATIVE
Glucose, UA: NEGATIVE mg/dL
Hgb urine dipstick: NEGATIVE
Ketones, ur: NEGATIVE mg/dL
Leukocytes,Ua: NEGATIVE
Nitrite: NEGATIVE
Protein, ur: NEGATIVE mg/dL
Specific Gravity, Urine: 1.009 (ref 1.005–1.030)
Squamous Epithelial / HPF: NONE SEEN (ref 0–5)
WBC, UA: NONE SEEN WBC/hpf (ref 0–5)
pH: 6 (ref 5.0–8.0)

## 2019-11-03 LAB — COMPREHENSIVE METABOLIC PANEL
ALT: 14 U/L (ref 0–44)
AST: 29 U/L (ref 15–41)
Albumin: 3.6 g/dL (ref 3.5–5.0)
Alkaline Phosphatase: 94 U/L (ref 38–126)
Anion gap: 6 (ref 5–15)
BUN: 11 mg/dL (ref 8–23)
CO2: 26 mmol/L (ref 22–32)
Calcium: 8.7 mg/dL — ABNORMAL LOW (ref 8.9–10.3)
Chloride: 101 mmol/L (ref 98–111)
Creatinine, Ser: 0.7 mg/dL (ref 0.44–1.00)
GFR calc Af Amer: >60 mL/min (ref >60)
GFR calc non Af Amer: >60 mL/min (ref >60)
Glucose, Bld: 171 mg/dL — ABNORMAL HIGH (ref 70–99)
Potassium: 3.3 mmol/L — ABNORMAL LOW (ref 3.5–5.1)
Sodium: 133 mmol/L — ABNORMAL LOW (ref 135–145)
Total Bilirubin: 0.5 mg/dL (ref 0.3–1.2)
Total Protein: 6.6 g/dL (ref 6.5–8.1)

## 2019-11-03 LAB — CBC
HCT: 37.2 % (ref 36.0–46.0)
Hemoglobin: 12.8 g/dL (ref 12.0–15.0)
MCH: 31.4 pg (ref 26.0–34.0)
MCHC: 34.4 g/dL (ref 30.0–36.0)
MCV: 91.2 fL (ref 80.0–100.0)
Platelets: 183 10*3/uL (ref 150–400)
RBC: 4.08 MIL/uL (ref 3.87–5.11)
RDW: 12.8 % (ref 11.5–15.5)
WBC: 4.3 10*3/uL (ref 4.0–10.5)
nRBC: 0 % (ref 0.0–0.2)

## 2019-11-03 LAB — TROPONIN I (HIGH SENSITIVITY): Troponin I (High Sensitivity): 10 ng/L (ref <18)

## 2019-11-03 MED ORDER — SODIUM CHLORIDE 0.9 % IV BOLUS
500.0000 mL | Freq: Once | INTRAVENOUS | Status: AC
  Administered 2019-11-03: 500 mL via INTRAVENOUS

## 2019-11-03 MED ORDER — SODIUM CHLORIDE 0.9% FLUSH
3.0000 mL | Freq: Once | INTRAVENOUS | Status: AC
  Administered 2019-11-03: 3 mL via INTRAVENOUS

## 2019-11-03 MED ORDER — ONDANSETRON HCL 4 MG/2ML IJ SOLN
4.0000 mg | Freq: Once | INTRAMUSCULAR | Status: AC
  Administered 2019-11-03: 4 mg via INTRAVENOUS
  Filled 2019-11-03: qty 2

## 2019-11-03 NOTE — ED Notes (Signed)
Patient transported to CT 

## 2019-11-03 NOTE — ED Provider Notes (Signed)
[facility] Emergency Department Provider Note    First MD Initiated Contact with Patient 11/03/19 2008     (approximate)  I have reviewed the triage vital signs and the nursing notes.   HISTORY  Chief Complaint Near Syncope    HPI Robin Fernandez is a 85 y.o. female below listed past medical history presents the ER for evaluation of near syncope.  Patient was helping her daughter decorate for Christmas started feeling lightheaded generalized weakness.  She was sitting when this happened.  Laid down on a couch.  They checked her blood pressure which is elevated and decided to call EMS.  She denies any pain.  No blurry vision.  No numbness or tingling.  No chest pain or shortness of breath.  No nausea or vomiting.  States she has a history of similar episodes happening when her sodium gets low.  Is also been having polyuria today.    Past Medical History:  Diagnosis Date   GERD (gastroesophageal reflux disease)    Glaucoma    Vasovagal syncope    History reviewed. No pertinent family history. Past Surgical History:  Procedure Laterality Date   ABDOMINAL HYSTERECTOMY     EYE SURGERY     Patient Active Problem List   Diagnosis Date Noted   TIA (transient ischemic attack) 11/19/2017   Syncope 05/04/2017      Prior to Admission medications   Medication Sig Start Date End Date Taking? Authorizing Provider  aspirin EC 81 MG EC tablet Take 1 tablet (81 mg total) by mouth daily. 05/07/17   Loletha Grayer, MD  azelastine (ASTELIN) 0.1 % nasal spray Place 2 sprays into both nostrils 2 (two) times daily. 08/14/17   [provider]  Cranberry 400 MG CAPS Take 800 mg by mouth 2 (two) times daily.    [provider]  diclofenac sodium (VOLTAREN) 1 % GEL Apply 4 g topically 4 (four) times daily. 01/29/19   [provider]  fluticasone (FLONASE) 50 MCG/ACT nasal spray Place 2 sprays into both nostrils daily. 07/11/18   [provider]  omeprazole (PRILOSEC) 20 MG capsule Take 1 capsule by mouth 2 (two) times daily. 10/17/17   [provider]  sodium chloride 1 g tablet Take 1 g by mouth 3 (three) times daily. 04/19/18   [provider]  Travoprost, BAK Free, (TRAVATAN) 0.004 % SOLN ophthalmic solution Place 1 drop into the left eye at bedtime.    [provider]  vitamin B-12 (CYANOCOBALAMIN) 500 MCG tablet Take 1,000 mcg by mouth daily.    [provider]  vitamin C (ASCORBIC ACID) 500 MG tablet Take 500 mg by mouth daily.    [provider]    Allergies Salicylates, Alendronate, Amoxil [amoxicillin], Azopt [brinzolamide], Deltasone [prednisone], Erythromycin, Lumigan [bimatoprost], Meloxicam, Ofloxacin, Parafon forte dsc [chlorzoxazone], and Prevacid [lansoprazole]    Social History Social History   Tobacco Use   Smoking status: Former Smoker    Types: Cigarettes   Smokeless tobacco: Never Used  Substance Use Topics   Alcohol use: No   Drug use: No    Review of Systems Patient denies headaches, rhinorrhea, blurry vision, numbness, shortness of breath, chest pain, edema, cough, abdominal pain, nausea, vomiting, diarrhea, dysuria, fevers, rashes or hallucinations unless otherwise stated above in HPI. ____________________________________________   PHYSICAL EXAM:  VITAL SIGNS: Vitals:   11/03/19 2200 11/03/19 2300  BP: (!) 177/65 (!) 186/76  Pulse: 79 79  Resp: (!) 25 (!) 24  Temp:  98.1 F (36.7 C)  SpO2: 94% 95%    Constitutional: Alert and oriented.  Eyes: Conjunctivae are normal.  Head: Atraumatic. Nose: No congestion/rhinnorhea. Mouth/Throat: Mucous membranes are moist.   Neck: No stridor. Painless ROM.  Cardiovascular: Normal rate, regular rhythm. Grossly normal heart sounds.  Good peripheral circulation. Respiratory: Normal respiratory effort.  No retractions. Lungs CTAB. Gastrointestinal: Soft and nontender. No distention. No  abdominal bruits. No CVA tenderness. Genitourinary:  Musculoskeletal: No lower extremity tenderness nor edema.  No joint effusions. Neurologic:  CN- intact.  No facial droop, Normal FNF.  Normal heel to shin.  Sensation intact bilaterally. Normal speech and language. No gross focal neurologic deficits are appreciated. No gait instability. Skin:  Skin is warm, dry and intact. No rash noted. Psychiatric: Mood and affect are normal. Speech and behavior are normal.  ____________________________________________   LABS (all labs ordered are listed, but only abnormal results are displayed)  Results for orders placed or performed during the hospital encounter of 11/03/19 (from the past 24 hour(s))  CBC     Status: None   Collection Time: 11/03/19  8:35 PM  Result Value Ref Range   WBC 4.3 4.0 - 10.5 K/uL   RBC 4.08 3.87 - 5.11 MIL/uL   Hemoglobin 12.8 12.0 - 15.0 g/dL   HCT 37.2 36.0 - 46.0 %   MCV 91.2 80.0 - 100.0 fL   MCH 31.4 26.0 - 34.0 pg   MCHC 34.4 30.0 - 36.0 g/dL   RDW 12.8 11.5 - 15.5 %   Platelets 183 150 - 400 K/uL   nRBC 0.0 0.0 - 0.2 %  Urinalysis, Complete w Microscopic     Status: Abnormal   Collection Time: 11/03/19  8:35 PM  Result Value Ref Range   Color, Urine STRAW (A) YELLOW   APPearance CLEAR (A) CLEAR   Specific Gravity, Urine 1.009 1.005 - 1.030   pH 6.0 5.0 - 8.0   Glucose, UA NEGATIVE NEGATIVE mg/dL   Hgb urine dipstick NEGATIVE NEGATIVE   Bilirubin Urine NEGATIVE NEGATIVE   Ketones, ur NEGATIVE NEGATIVE mg/dL   Protein, ur NEGATIVE NEGATIVE mg/dL   Nitrite NEGATIVE NEGATIVE   Leukocytes,Ua NEGATIVE NEGATIVE   WBC, UA NONE SEEN 0 - 5 WBC/hpf   Bacteria, UA NONE SEEN NONE SEEN   Squamous Epithelial / LPF NONE SEEN 0 - 5  Comprehensive metabolic panel     Status: Abnormal   Collection Time: 11/03/19  8:35 PM  Result Value Ref Range   Sodium 133 (L) 135 - 145 mmol/L   Potassium 3.3 (L) 3.5 - 5.1 mmol/L   Chloride 101 98 - 111 mmol/L   CO2 26 22 - 32  mmol/L   Glucose, Bld 171 (H) 70 - 99 mg/dL   BUN 11 8 - 23 mg/dL   Creatinine, Ser 0.70 0.44 - 1.00 mg/dL   Calcium 8.7 (L) 8.9 - 10.3 mg/dL   Total Protein 6.6 6.5 - 8.1 g/dL   Albumin 3.6 3.5 - 5.0 g/dL   AST 29 15 - 41 U/L   ALT 14 0 - 44 U/L   Alkaline Phosphatase 94 38 - 126 U/L   Total Bilirubin 0.5 0.3 - 1.2 mg/dL   GFR calc non Af Amer >60 >60 mL/min   GFR calc Af Amer >60 >60 mL/min   Anion gap 6 5 - 15  Troponin I (High Sensitivity)     Status: None   Collection Time: 11/03/19  8:35 PM  Result Value Ref  Range   Troponin I (High Sensitivity) 10 <18 ng/L   ____________________________________________  EKG My review and personal interpretation at Time: 20:20   Indication: weakness  Rate: 90  Rhythm: sinus Axis: normal Other: normal intervals, no stemi ____________________________________________  RADIOLOGY  I personally reviewed all radiographic images ordered to evaluate for the above acute complaints and reviewed radiology reports and findings.  These findings were personally discussed with the patient.  Please see medical record for radiology report.  ____________________________________________   PROCEDURES  Procedure(s) performed:  Procedures    Critical Care performed: no ____________________________________________   INITIAL IMPRESSION / ASSESSMENT AND PLAN / ED COURSE  Pertinent labs & imaging results that were available during my care of the patient were reviewed by me and considered in my medical decision making (see chart for details).   DDX: dehydration, electrolyte abn, anemia, uti, cva, mass, chf, pna  EUNIE YAKE is a 85 y.o. who presents to the ED with symptoms as described above.  Patient arrives nontoxic in no acute distress.  She is afebrile hemodynamically stable.  Exam reassuring.  Clinical Course as of Nov 02 2326  Nancy Fetter Nov 03, 2019  2325 Work-up thus far is reassuring.  No evidence of significant hyponatremia.  No evidence of  mass.  Not consistent with CVA.  No evidence of pneumonia or infectious process.  EKG is nonischemic initial heart enzymes are negative.  Patient still describing vague uneasiness that almost sounds nausea related.  Will trial Zofran and give additional IV fluids.  Repeat abdominal exam is soft and benign.  No evidence of UTI.  Patient be signed out to oncoming physician pending repeat troponin.  That is negative patient otherwise well anticipate discharge home.   [PR]    Clinical Course User Index [PR] Merlyn Lot, MD    The patient was evaluated in Emergency Department today for the symptoms described in the history of present illness. He/she was evaluated in the context of the global COVID-19 pandemic, which necessitated consideration that the patient might be at risk for infection with the SARS-CoV-2 virus that causes COVID-19. Institutional protocols and algorithms that pertain to the evaluation of patients at risk for COVID-19 are in a state of rapid change based on information released by regulatory bodies including the CDC and federal and state organizations. These policies and algorithms were followed during the patient's care in the ED.  As part of my medical decision making, I reviewed the following data within the [facility] notes reviewed and incorporated, Labs reviewed, notes from prior ED visits and [place] Controlled Substance Database   ____________________________________________   FINAL CLINICAL IMPRESSION(S) / ED DIAGNOSES  Final diagnoses:  Weakness      NEW MEDICATIONS STARTED DURING THIS VISIT:  New Prescriptions   No medications on file     Note:  This document was prepared using Dragon voice recognition software and may include unintentional dictation errors.    Merlyn Lot, MD 11/03/19 [phone]

## 2019-11-03 NOTE — ED Notes (Signed)
Family at bedside. 

## 2019-11-03 NOTE — ED Notes (Signed)
Lab called for add on 

## 2019-11-03 NOTE — ED Triage Notes (Signed)
Patient presents to Emergency Department via [facility] from Home with complaints of near syncope.  Pt was putting up Christmas decorations when pt got "very sleepy" and her daughter helped her sit down.   History of hyponatremia and pt takes salt tabs for same

## 2019-11-04 ENCOUNTER — Observation Stay
Admit: 2019-11-04 | Discharge: 2019-11-04 | Disposition: A | Discharge status: Home / Self Care | Payer: PPO | Attending: Family Medicine | Admitting: Family Medicine

## 2019-11-04 ENCOUNTER — Observation Stay: Payer: PPO

## 2019-11-04 DIAGNOSIS — Z87891 Personal history of nicotine dependence: Secondary | ICD-10-CM | POA: Diagnosis not present

## 2019-11-04 DIAGNOSIS — R42 Dizziness and giddiness: Secondary | ICD-10-CM

## 2019-11-04 DIAGNOSIS — R29702 NIHSS score 2: Secondary | ICD-10-CM | POA: Diagnosis present

## 2019-11-04 DIAGNOSIS — I6381 Other cerebral infarction due to occlusion or stenosis of small artery: Secondary | ICD-10-CM | POA: Diagnosis present

## 2019-11-04 DIAGNOSIS — E876 Hypokalemia: Secondary | ICD-10-CM | POA: Diagnosis present

## 2019-11-04 DIAGNOSIS — R4182 Altered mental status, unspecified: Secondary | ICD-10-CM | POA: Diagnosis present

## 2019-11-04 DIAGNOSIS — Z90710 Acquired absence of both cervix and uterus: Secondary | ICD-10-CM | POA: Diagnosis not present

## 2019-11-04 DIAGNOSIS — R410 Disorientation, unspecified: Secondary | ICD-10-CM | POA: Diagnosis not present

## 2019-11-04 DIAGNOSIS — G459 Transient cerebral ischemic attack, unspecified: Secondary | ICD-10-CM | POA: Diagnosis present

## 2019-11-04 DIAGNOSIS — R29810 Facial weakness: Secondary | ICD-10-CM | POA: Diagnosis present

## 2019-11-04 DIAGNOSIS — G92 Toxic encephalopathy: Secondary | ICD-10-CM | POA: Diagnosis present

## 2019-11-04 DIAGNOSIS — R531 Weakness: Secondary | ICD-10-CM | POA: Diagnosis not present

## 2019-11-04 DIAGNOSIS — I10 Essential (primary) hypertension: Secondary | ICD-10-CM | POA: Diagnosis present

## 2019-11-04 DIAGNOSIS — I63531 Cerebral infarction due to unspecified occlusion or stenosis of right posterior cerebral artery: Secondary | ICD-10-CM | POA: Diagnosis not present

## 2019-11-04 DIAGNOSIS — Z20828 Contact with and (suspected) exposure to other viral communicable diseases: Secondary | ICD-10-CM | POA: Diagnosis present

## 2019-11-04 DIAGNOSIS — I160 Hypertensive urgency: Secondary | ICD-10-CM | POA: Diagnosis present

## 2019-11-04 DIAGNOSIS — Z7982 Long term (current) use of aspirin: Secondary | ICD-10-CM | POA: Diagnosis not present

## 2019-11-04 DIAGNOSIS — H409 Unspecified glaucoma: Secondary | ICD-10-CM | POA: Diagnosis present

## 2019-11-04 DIAGNOSIS — I639 Cerebral infarction, unspecified: Secondary | ICD-10-CM | POA: Diagnosis not present

## 2019-11-04 DIAGNOSIS — R29898 Other symptoms and signs involving the musculoskeletal system: Secondary | ICD-10-CM | POA: Diagnosis not present

## 2019-11-04 DIAGNOSIS — Z888 Allergy status to other drugs, medicaments and biological substances status: Secondary | ICD-10-CM | POA: Diagnosis not present

## 2019-11-04 DIAGNOSIS — I472 Ventricular tachycardia: Secondary | ICD-10-CM | POA: Diagnosis not present

## 2019-11-04 DIAGNOSIS — R55 Syncope and collapse: Secondary | ICD-10-CM

## 2019-11-04 DIAGNOSIS — Z886 Allergy status to analgesic agent status: Secondary | ICD-10-CM | POA: Diagnosis not present

## 2019-11-04 DIAGNOSIS — G8194 Hemiplegia, unspecified affecting left nondominant side: Secondary | ICD-10-CM | POA: Diagnosis present

## 2019-11-04 DIAGNOSIS — I739 Peripheral vascular disease, unspecified: Secondary | ICD-10-CM | POA: Diagnosis present

## 2019-11-04 DIAGNOSIS — Z79899 Other long term (current) drug therapy: Secondary | ICD-10-CM | POA: Diagnosis not present

## 2019-11-04 DIAGNOSIS — Z8673 Personal history of transient ischemic attack (TIA), and cerebral infarction without residual deficits: Secondary | ICD-10-CM | POA: Diagnosis not present

## 2019-11-04 DIAGNOSIS — K219 Gastro-esophageal reflux disease without esophagitis: Secondary | ICD-10-CM | POA: Diagnosis present

## 2019-11-04 DIAGNOSIS — E871 Hypo-osmolality and hyponatremia: Secondary | ICD-10-CM | POA: Diagnosis present

## 2019-11-04 DIAGNOSIS — I6523 Occlusion and stenosis of bilateral carotid arteries: Secondary | ICD-10-CM | POA: Diagnosis not present

## 2019-11-04 LAB — CBC
HCT: 37.6 % (ref 36.0–46.0)
Hemoglobin: 12.7 g/dL (ref 12.0–15.0)
MCH: 31.6 pg (ref 26.0–34.0)
MCHC: 33.8 g/dL (ref 30.0–36.0)
MCV: 93.5 fL (ref 80.0–100.0)
Platelets: 197 10*3/uL (ref 150–400)
RBC: 4.02 MIL/uL (ref 3.87–5.11)
RDW: 12.7 % (ref 11.5–15.5)
WBC: 5 10*3/uL (ref 4.0–10.5)
nRBC: 0 % (ref 0.0–0.2)

## 2019-11-04 LAB — BASIC METABOLIC PANEL
Anion gap: 7 (ref 5–15)
BUN: 7 mg/dL — ABNORMAL LOW (ref 8–23)
CO2: 26 mmol/L (ref 22–32)
Calcium: 8.4 mg/dL — ABNORMAL LOW (ref 8.9–10.3)
Chloride: 99 mmol/L (ref 98–111)
Creatinine, Ser: 0.42 mg/dL — ABNORMAL LOW (ref 0.44–1.00)
GFR calc Af Amer: >60 mL/min (ref >60)
GFR calc non Af Amer: >60 mL/min (ref >60)
Glucose, Bld: 129 mg/dL — ABNORMAL HIGH (ref 70–99)
Potassium: 3.8 mmol/L (ref 3.5–5.1)
Sodium: 132 mmol/L — ABNORMAL LOW (ref 135–145)

## 2019-11-04 LAB — LIPID PANEL
Cholesterol: 162 mg/dL (ref 0–200)
HDL: 36 mg/dL — ABNORMAL LOW (ref >40)
LDL Cholesterol: 117 mg/dL — ABNORMAL HIGH (ref 0–99)
Total CHOL/HDL Ratio: 4.5 RATIO
Triglycerides: 47 mg/dL (ref <150)
VLDL: 9 mg/dL (ref 0–40)

## 2019-11-04 LAB — TROPONIN I (HIGH SENSITIVITY): Troponin I (High Sensitivity): 31 ng/L — ABNORMAL HIGH (ref <18)

## 2019-11-04 LAB — SARS CORONAVIRUS 2 (TAT 6-24 HRS): SARS Coronavirus 2: NEGATIVE

## 2019-11-04 LAB — MAGNESIUM: Magnesium: 2 mg/dL (ref 1.7–2.4)

## 2019-11-04 MED ORDER — CRANBERRY 400 MG PO CAPS: 800.0000 mg | ORAL_CAPSULE | Freq: Two times a day (BID) | ORAL | Status: DC

## 2019-11-04 MED ORDER — AZELASTINE HCL 0.1 % NA SOLN
2.0000 | Freq: Two times a day (BID) | NASAL | Status: DC | PRN
  Filled 2019-11-04: qty 30

## 2019-11-04 MED ORDER — LATANOPROST 0.005 % OP SOLN
1.0000 [drp] | Freq: Every day | OPHTHALMIC | Status: DC
  Filled 2019-11-04: qty 2.5

## 2019-11-04 MED ORDER — SIMVASTATIN 10 MG PO TABS: 20.0000 mg | ORAL_TABLET | Freq: Every day | ORAL | Status: DC

## 2019-11-04 MED ORDER — ACETAMINOPHEN 650 MG RE SUPP: 650.0000 mg | Freq: Four times a day (QID) | RECTAL | Status: DC | PRN

## 2019-11-04 MED ORDER — ONDANSETRON HCL 4 MG/2ML IJ SOLN: 4.0000 mg | Freq: Four times a day (QID) | INTRAMUSCULAR | Status: DC | PRN

## 2019-11-04 MED ORDER — SODIUM CHLORIDE 0.9 % IV BOLUS
500.0000 mL | Freq: Once | INTRAVENOUS | Status: AC
  Administered 2019-11-04: 500 mL via INTRAVENOUS

## 2019-11-04 MED ORDER — ATORVASTATIN CALCIUM 20 MG PO TABS
40.0000 mg | ORAL_TABLET | Freq: Every day | ORAL | Status: DC
  Administered 2019-11-04 – 2019-11-05 (×2): 40 mg via ORAL
  Filled 2019-11-04 (×2): qty 2

## 2019-11-04 MED ORDER — ACETAMINOPHEN 325 MG PO TABS: 650.0000 mg | ORAL_TABLET | Freq: Four times a day (QID) | ORAL | Status: DC | PRN

## 2019-11-04 MED ORDER — ASPIRIN 325 MG PO TABS
325.0000 mg | ORAL_TABLET | Freq: Every day | ORAL | Status: DC
  Administered 2019-11-04 – 2019-11-06 (×3): 325 mg via ORAL
  Filled 2019-11-04 (×3): qty 1

## 2019-11-04 MED ORDER — ASPIRIN 300 MG RE SUPP: 300.0000 mg | Freq: Every day | RECTAL | Status: DC

## 2019-11-04 MED ORDER — CALCIUM GLUCONATE 10 % IV SOLN
1.0000 g | Freq: Once | INTRAVENOUS | Status: AC
  Administered 2019-11-04: 1 g via INTRAVENOUS
  Filled 2019-11-04: qty 10

## 2019-11-04 MED ORDER — FLUTICASONE PROPIONATE 50 MCG/ACT NA SUSP
2.0000 | Freq: Every day | NASAL | Status: DC | PRN
  Filled 2019-11-04: qty 16

## 2019-11-04 MED ORDER — PANTOPRAZOLE SODIUM 40 MG PO TBEC
40.0000 mg | DELAYED_RELEASE_TABLET | Freq: Every day | ORAL | Status: DC
  Administered 2019-11-04 – 2019-11-06 (×3): 40 mg via ORAL
  Filled 2019-11-04 (×3): qty 1

## 2019-11-04 MED ORDER — SODIUM CHLORIDE 1 G PO TABS
1.0000 g | ORAL_TABLET | Freq: Three times a day (TID) | ORAL | Status: DC
  Administered 2019-11-04 – 2019-11-06 (×7): 1 g via ORAL
  Filled 2019-11-04 (×9): qty 1

## 2019-11-04 MED ORDER — MAGNESIUM HYDROXIDE 400 MG/5ML PO SUSP
30.0000 mL | Freq: Every day | ORAL | Status: DC | PRN
  Filled 2019-11-04: qty 30

## 2019-11-04 MED ORDER — ONDANSETRON HCL 4 MG PO TABS: 4.0000 mg | ORAL_TABLET | Freq: Four times a day (QID) | ORAL | Status: DC | PRN

## 2019-11-04 MED ORDER — VITAMIN C 500 MG PO TABS
500.0000 mg | ORAL_TABLET | Freq: Every day | ORAL | Status: DC
  Administered 2019-11-04 – 2019-11-06 (×3): 500 mg via ORAL
  Filled 2019-11-04 (×3): qty 1

## 2019-11-04 MED ORDER — SODIUM CHLORIDE 0.9% FLUSH
3.0000 mL | Freq: Two times a day (BID) | INTRAVENOUS | Status: DC
  Administered 2019-11-04 – 2019-11-06 (×5): 3 mL via INTRAVENOUS

## 2019-11-04 MED ORDER — ALUM & MAG HYDROXIDE-SIMETH 200-200-20 MG/5ML PO SUSP: 30.0000 mL | Freq: Four times a day (QID) | ORAL | Status: DC | PRN

## 2019-11-04 MED ORDER — TRAZODONE HCL 50 MG PO TABS
25.0000 mg | ORAL_TABLET | Freq: Every evening | ORAL | Status: DC | PRN
  Administered 2019-11-04 – 2019-11-05 (×2): 25 mg via ORAL
  Filled 2019-11-04 (×2): qty 1

## 2019-11-04 MED ORDER — POTASSIUM CHLORIDE CRYS ER 20 MEQ PO TBCR
20.0000 meq | EXTENDED_RELEASE_TABLET | Freq: Once | ORAL | Status: AC
  Administered 2019-11-04: 20 meq via ORAL
  Filled 2019-11-04: qty 1

## 2019-11-04 MED ORDER — VITAMIN B-12 1000 MCG PO TABS
1000.0000 ug | ORAL_TABLET | Freq: Every day | ORAL | Status: DC
  Administered 2019-11-04 – 2019-11-06 (×3): 1000 ug via ORAL
  Filled 2019-11-04 (×3): qty 1

## 2019-11-04 MED ORDER — LABETALOL HCL 5 MG/ML IV SOLN: 20.0000 mg | INTRAVENOUS | Status: DC | PRN

## 2019-11-04 MED ORDER — STROKE: EARLY STAGES OF RECOVERY BOOK
Freq: Once | Status: AC
  Administered 2019-11-04: 18:00:00

## 2019-11-04 MED ORDER — HYDRALAZINE HCL 20 MG/ML IJ SOLN
10.0000 mg | Freq: Once | INTRAMUSCULAR | Status: AC
  Administered 2019-11-04: 10 mg via INTRAVENOUS
  Filled 2019-11-04: qty 1

## 2019-11-04 MED ORDER — PHENYLEPHRINE 200 MCG/ML FOR PRIAPISM / HYPOTENSION
50.0000 ug | INTRAMUSCULAR | Status: DC | PRN
  Administered 2019-11-04: 50 ug via INTRACAVERNOUS
  Filled 2019-11-04: qty 50

## 2019-11-04 MED ORDER — ENOXAPARIN SODIUM 40 MG/0.4ML ~~LOC~~ SOLN
40.0000 mg | SUBCUTANEOUS | Status: DC
  Administered 2019-11-04 – 2019-11-05 (×2): 40 mg via SUBCUTANEOUS
  Filled 2019-11-04 (×2): qty 0.4

## 2019-11-04 MED ORDER — ASPIRIN EC 81 MG PO TBEC: 81.0000 mg | DELAYED_RELEASE_TABLET | Freq: Every day | ORAL | Status: DC

## 2019-11-04 MED ORDER — SODIUM CHLORIDE 0.9 % IV SOLN
INTRAVENOUS | Status: DC
  Administered 2019-11-04: 06:00:00 via INTRAVENOUS

## 2019-11-04 MED ORDER — TRAVOPROST (BAK FREE) 0.004 % OP SOLN
1.0000 [drp] | Freq: Every day | OPHTHALMIC | Status: DC
  Administered 2019-11-04 – 2019-11-05 (×2): 1 [drp] via OPHTHALMIC
  Filled 2019-11-04: qty 2.5

## 2019-11-04 NOTE — Progress Notes (Signed)
PHARMACIST - PHYSICIAN ORDER COMMUNICATION  CONCERNING: P&T Medication Policy on Herbal Medications  DESCRIPTION:  This patient's order for:  Cranberry Capsules  has been noted.  This product(s) is classified as an "herbal" or natural product. Due to a lack of definitive safety studies or FDA approval, nonstandard manufacturing practices, plus the potential risk of unknown drug-drug interactions while on inpatient medications, the Pharmacy and Therapeutics Committee does not permit the use of "herbal" or natural products of this type within [facility].   ACTION TAKEN: The pharmacy department is unable to verify this order at this time.  Please reevaluate patient's clinical condition at discharge and address if the herbal or natural product(s) should be resumed at that time.  Pernell Dupre, PharmD, BCPS Clinical Pharmacist 11/04/2019 2:13 AM

## 2019-11-04 NOTE — Progress Notes (Signed)
*  PRELIMINARY RESULTS* Echocardiogram 2D Echocardiogram has been performed.  Robin Fernandez 11/04/2019, 3:51 PM

## 2019-11-04 NOTE — Progress Notes (Signed)
PROGRESS NOTE    [name]  [id number] DOB: 06-27-1929 DOA: 11/03/2019 PCP: Rusty Aus, MD      Brief Narrative:  Robin Fernandez is a 85 y.o. F with glaucoma who presents with acute dizziness and presyncope.    In the ER, she noted left sided "heaviness" and motor weakness.  BP 208/84, head CT normal.        Assessment & Plan:  Left sided weakness, facial droop -Obtain MRI brain, MRA head and neck -Echocardiogram pending -Carotid imaging unremarkable   -Lipids ordered: LDL 117; replaced simvastatin 20 with atorvastatin 40 -Aspirin 325 ordered at admission   -Atrial fibrillation: not previously known -Dysphagia screen ordered in ER -PT eval ordered   Ventricular tachycardia Having short runs of VT on monitor.  K 3.8.  Asymptomatic, hemodynamically stable -Check mag -Keep K>4, mag>2 -Small K suppl  Hyponatremia Mild, asymptomatic  Glaucoma -Continue eye drops  GER -Continue PPI         DVT prophylaxis: Lovenox Code Status: FULL Family Communication:  MDM and disposition Plan: This is a no charge note.  For further details, please see H&P by my partner Dr. Sidney Ace from earlier today.  The below labs and imaging reports were reviewed and summarized above.    The patient was admitted with stroke like symptoms    Objective: Vitals:   11/04/19 0655 11/04/19 0700 11/04/19 0722 11/04/19 0934  BP: (!) 117/51 (!) 127/41 135/61 (!) 136/49  Pulse: (!) 103 (!) 104 (!) 108 (!) 104  Resp: (!) 28 18 20 20   Temp:   97.8 F (36.6 C)   TempSrc:   Oral   SpO2: 97% 96% 96% 97%  Weight:      Height:        Intake/Output Summary (Last 24 hours) at 11/04/2019 1045 Last data filed at 11/04/2019 0900 Gross per 24 hour  Intake 1620 ml  Output 1000 ml  Net 620 ml   Filed Weights   11/03/19 2018  Weight: 55.3 kg    Examination: The patient was seen and examined.      Data Reviewed: I have personally reviewed following labs and imaging  studies:  CBC: Recent Labs  Lab 11/03/19 2035 11/04/19 0547  WBC 4.3 5.0  HGB 12.8 12.7  HCT 37.2 37.6  MCV 91.2 93.5  PLT 183 [id number]   Basic Metabolic Panel: Recent Labs  Lab 11/03/19 2035 11/04/19 0547  NA 133* 132*  K 3.3* 3.8  CL 101 99  CO2 26 26  GLUCOSE 171* 129*  BUN 11 7*  CREATININE 0.70 0.42*  CALCIUM 8.7* 8.4*   GFR: Estimated Creatinine Clearance: 33.7 mL/min (A) (by C-G formula based on SCr of 0.42 mg/dL (L)). Liver Function Tests: Recent Labs  Lab 11/03/19 2035  AST 29  ALT 14  ALKPHOS 94  BILITOT 0.5  PROT 6.6  ALBUMIN 3.6   No results for input(s): LIPASE, AMYLASE in the last 168 hours. No results for input(s): AMMONIA in the last 168 hours. Coagulation Profile: No results for input(s): INR, PROTIME in the last 168 hours. Cardiac Enzymes: No results for input(s): CKTOTAL, CKMB, CKMBINDEX, TROPONINI in the last 168 hours. BNP (last 3 results) No results for input(s): PROBNP in the last 8760 hours. HbA1C: No results for input(s): HGBA1C in the last 72 hours. CBG: No results for input(s): GLUCAP in the last 168 hours. Lipid Profile: Recent Labs    11/04/19 0547  CHOL 162  HDL 36*  LDLCALC 117*  TRIG 47  CHOLHDL 4.5   Thyroid Function Tests: No results for input(s): TSH, T4TOTAL, FREET4, T3FREE, THYROIDAB in the last 72 hours. Anemia Panel: No results for input(s): VITAMINB12, FOLATE, FERRITIN, TIBC, IRON, RETICCTPCT in the last 72 hours. Urine analysis:    Component Value Date/Time   COLORURINE STRAW (A) 11/03/2019 2035   APPEARANCEUR CLEAR (A) 11/03/2019 2035   APPEARANCEUR Hazy 07/06/2014 1426   LABSPEC 1.009 11/03/2019 2035   LABSPEC 1.017 07/06/2014 1426   PHURINE 6.0 11/03/2019 2035   GLUCOSEU NEGATIVE 11/03/2019 2035   GLUCOSEU Negative 07/06/2014 1426   HGBUR NEGATIVE 11/03/2019 2035   BILIRUBINUR NEGATIVE 11/03/2019 2035   BILIRUBINUR Negative 07/06/2014 1426   KETONESUR NEGATIVE 11/03/2019 2035   PROTEINUR NEGATIVE  11/03/2019 2035   NITRITE NEGATIVE 11/03/2019 2035   LEUKOCYTESUR NEGATIVE 11/03/2019 2035   LEUKOCYTESUR 3+ 07/06/2014 1426   Sepsis Labs: @LABRCNTIP (procalcitonin:4,lacticacidven:4)  ) Recent Results (from the past 240 hour(s))  SARS CORONAVIRUS 2 (TAT 6-24 HRS) Nasopharyngeal Nasopharyngeal Swab     Status: None   Collection Time: 11/03/19 11:29 PM   Specimen: Nasopharyngeal Swab  Result Value Ref Range Status   SARS Coronavirus 2 NEGATIVE NEGATIVE Final    Comment: (NOTE) SARS-CoV-2 target nucleic acids are NOT DETECTED. The SARS-CoV-2 RNA is generally detectable in upper and lower respiratory specimens during the acute phase of infection. Negative results do not preclude SARS-CoV-2 infection, do not rule out co-infections with other pathogens, and should not be used as the sole basis for treatment or other patient management decisions. Negative results must be combined with clinical observations, patient history, and epidemiological information. The expected result is Negative. Fact Sheet for Patients: SugarRoll.be Fact Sheet for Healthcare Providers: https://www.woods-mathews.com/ This test is not yet approved or cleared by the [place] FDA and  has been authorized for detection and/or diagnosis of SARS-CoV-2 by FDA under an Emergency Use Authorization (EUA). This EUA will remain  in effect (meaning this test can be used) for the duration of the COVID-19 declaration under Section 56 4(b)(1) of the Act, 21 U.S.C. section 360bbb-3(b)(1), unless the authorization is terminated or revoked sooner. Performed at [facility], [place] [address], [place], [place] [address]          Radiology Studies: Ct Head Wo Contrast  Result Date: 11/03/2019 CLINICAL DATA:  Altered level of consciousness. Near syncope. EXAM: CT HEAD WITHOUT CONTRAST TECHNIQUE: Contiguous axial images were obtained from the base of the skull through the  vertex without intravenous contrast. COMPARISON:  Head CT 11/19/2017, brain MRI 11/20/2017 FINDINGS: Brain: No intracranial hemorrhage, mass effect, or midline shift. Brain volume is normal for age. Unchanged chronic small vessel ischemia. No hydrocephalus. The basilar cisterns are patent. No evidence of territorial infarct or acute ischemia. No extra-axial or intracranial fluid collection. Vascular: Atherosclerosis of skullbase vasculature without hyperdense vessel or abnormal calcification. Skull: No fracture or focal lesion. Sinuses/Orbits: Paranasal sinuses and mastoid air cells are clear. The visualized orbits are unremarkable. Bilateral lens extraction. Other: None. IMPRESSION: 1. No acute intracranial abnormality. 2. Unchanged age related atrophy and chronic small vessel ischemia. Electronically Signed   By: Keith Rake M.D.   On: 11/03/2019 23:04   US Carotid Bilateral  Result Date: 11/04/2019 CLINICAL DATA:  TIA symptoms, hyperlipidemia recent syncope EXAM: BILATERAL CAROTID DUPLEX ULTRASOUND TECHNIQUE: Pearline Cables scale imaging, color Doppler and duplex ultrasound were performed of bilateral carotid and vertebral arteries in the neck. COMPARISON:  11/19/2017 FINDINGS: Criteria: Quantification of carotid stenosis is based on  velocity parameters that correlate the residual internal carotid diameter with NASCET-based stenosis levels, using the diameter of the distal internal carotid lumen as the denominator for stenosis measurement. The following velocity measurements were obtained: RIGHT ICA: 102/21 cm/sec CCA: [id number] cm/sec SYSTOLIC ICA/CCA RATIO:  0.9 ECA: 154 cm/sec LEFT ICA: 157/14 cm/sec CCA: [id number] cm/sec SYSTOLIC ICA/CCA RATIO:  1.4 ECA: 149 cm/sec RIGHT CAROTID ARTERY: Minor echogenic shadowing plaque formation. No hemodynamically significant right ICA stenosis, velocity elevation, or turbulent flow. Degree of narrowing less than 50%. RIGHT VERTEBRAL ARTERY:  Antegrade LEFT CAROTID ARTERY: Similar  scattered minor echogenic plaque formation. No hemodynamically significant left ICA stenosis, velocity elevation, or turbulent flow. LEFT VERTEBRAL ARTERY:  Antegrade IMPRESSION: Minor carotid atherosclerosis. No hemodynamically significant ICA stenosis. Degree of narrowing less than 50% bilaterally by ultrasound criteria. Patent antegrade vertebral flow bilaterally Electronically Signed   By: Jerilynn Mages.  Shick M.D.   On: 11/04/2019 09:02   Dg Chest Portable 1 View  Result Date: 11/03/2019 CLINICAL DATA:  85 year old female with near syncope. EXAM: PORTABLE CHEST 1 VIEW COMPARISON:  Chest radiograph dated 01/30/2019. FINDINGS: Mild diffuse interstitial prominence. No focal consolidation, pleural effusion, or pneumothorax. Stable cardiac silhouette. No acute osseous pathology. Osteopenia. IMPRESSION: 1. No acute cardiopulmonary process. 2. Stable mild interstitial prominence. Electronically Signed   By: Anner Crete M.D.   On: 11/03/2019 21:08        Scheduled Meds:  aspirin EC  81 mg Oral Daily   enoxaparin (LOVENOX) injection  40 mg Subcutaneous Q24H   latanoprost  1 drop Left Eye QHS   pantoprazole  40 mg Oral Daily   simvastatin  20 mg Oral q1800   sodium chloride flush  3 mL Intravenous Q12H   sodium chloride  1 g Oral TID   vitamin B-12  1,000 mcg Oral Daily   vitamin C  500 mg Oral Daily   Continuous Infusions:  sodium chloride 75 mL/hr at 11/04/19 0544     LOS: 0 days    Time spent: 15 minutes    Edwin Dada, MD [facility] 11/04/2019, 10:45 AM     Please page though [place] or Epic secure chat:  For password, contact charge nurse

## 2019-11-04 NOTE — ED Notes (Signed)
Report to [facility], tech to tx pt to floor.

## 2019-11-04 NOTE — ED Provider Notes (Signed)
Procedures  Clinical Course as of Nov 04 107  Sun Nov 03, 2019  2325 Work-up thus far is reassuring.  No evidence of significant hyponatremia.  No evidence of mass.  Not consistent with CVA.  No evidence of pneumonia or infectious process.  EKG is nonischemic initial heart enzymes are negative.  Patient still describing vague uneasiness that almost sounds nausea related.  Will trial Zofran and give additional IV fluids.  Repeat abdominal exam is soft and benign.  No evidence of UTI.  Patient be signed out to oncoming physician pending repeat troponin.  That is negative patient otherwise well anticipate discharge home.   [PR]    Clinical Course User Index [PR] Merlyn Lot, MD    ----------------------------------------- 1:08 AM on 11/04/2019 ----------------------------------------- Patient still feeling ill, not symptomatically improved after IV fluids.  She feels somewhat confused although she is oriented.  Serial troponins have risen from an initial level of 10 to 31.  Although presentation does not worry me about ACS at this time, it is nonreassuring and with her age and comorbidities I recommended hospitalization for further evaluation and management to which she agrees.    Carrie Mew, MD 11/04/19 0110

## 2019-11-04 NOTE — H&P (Signed)
[facility] NAME: Robin Fernandez    MR#:  [id number]  DATE OF BIRTH:  [birth year]  DATE OF ADMISSION:  11/04/2019  PRIMARY CARE PHYSICIAN: Rusty Aus, MD   REQUESTING/REFERRING PHYSICIAN: Brenton Grills, MD CHIEF COMPLAINT:   Chief Complaint  Patient presents with  . Near Syncope    HISTORY OF PRESENT ILLNESS:  Robin Fernandez  is a 85 y.o. pleasant Caucasian female with a known history of  glaucoma and GERD, who presented to the emergency room with acute onset of lightheadedness and dizziness with presyncope while sitting.  She has been fairly confused after that.  While she was in the emergency room she complained of left-sided heaviness.  She admits to nausea without vomiting or diarrhea.  She denies any chest pain or palpitations.  No fever or chills.  No paresthesias or facial weakness or numbness.  No dysphagia or dysarthria.  No urinary or stool incontinence.  No witnessed seizures.  Upon presentation to the emergency room, blood pressure was 208/84 with respiratory to 22 with otherwise normal vital signs.  Labs revealed mild hyponatremia and hypokalemia, otherwise unremarkable.  COVID-19 test is currently pending.  Troponin I initially was 10 and went up to 31.  Urinalysis was negative.  Head CT scan without contrast revealed no acute intracranial abnormalities.  Portable chest x-ray showed stable mild interstitial prominence with no acute cardiopulmonary disease.  EKG showed normal sinus rhythm with a rate of 62.  The patient was given 4 mg of IV Zofran and 500 mg of IV normal saline bolus.  She will be admitted to an observation medically monitored bed for further evaluation and monitoring.   PAST MEDICAL HISTORY:   Past Medical History:  Diagnosis Date  . GERD (gastroesophageal reflux disease)   . Glaucoma   . Vasovagal syncope     PAST SURGICAL HISTORY:   Past Surgical History:  Procedure Laterality Date  . ABDOMINAL  HYSTERECTOMY    . EYE SURGERY      SOCIAL HISTORY:   Social History   Tobacco Use  . Smoking status: Former Smoker    Types: Cigarettes  . Smokeless tobacco: Never Used  Substance Use Topics  . Alcohol use: No    FAMILY HISTORY:  History reviewed. No pertinent family history.  DRUG ALLERGIES:   Allergies  Allergen Reactions  . Salicylates Hives    Aspirin-like analgesic, salicylates group--urticaria  . Alendronate Other (See Comments)  . Amoxil [Amoxicillin] Other (See Comments)    Diarrhea, Blood in Stool  . Azopt [Brinzolamide] Other (See Comments)    Tingling in arms and feet  . Deltasone [Prednisone]     Affects blood pressure  . Erythromycin Other (See Comments)    Diarrhea, Blood in Stool   . Lumigan [Bimatoprost]     Nervousness  . Meloxicam Other (See Comments)  . Ofloxacin Other (See Comments)  . Parafon Forte Dsc [Chlorzoxazone] Other (See Comments)    "made very sick"  . Prevacid [Lansoprazole] Swelling    REVIEW OF SYSTEMS:   ROS As per history of present illness. All pertinent systems were reviewed above. Constitutional,  HEENT, cardiovascular, respiratory, GI, GU, musculoskeletal, neuro, psychiatric, endocrine,  integumentary and hematologic systems were reviewed and are otherwise  negative/unremarkable except for positive findings mentioned above in the HPI.   MEDICATIONS AT HOME:   Prior to Admission medications   Medication Sig Start Date End Date Taking? Authorizing Provider  aspirin EC 81 MG EC  tablet Take 1 tablet (81 mg total) by mouth daily. 05/07/17  Yes Wieting, Richard, MD  azelastine (ASTELIN) 0.1 % nasal spray Place 2 sprays into both nostrils 2 (two) times daily. 08/14/17  Yes [provider]  Cranberry 400 MG CAPS Take 800 mg by mouth 2 (two) times daily.   Yes [provider]  diclofenac sodium (VOLTAREN) 1 % GEL Apply 4 g topically 4 (four) times daily. 01/29/19  Yes [provider]  fluticasone  (FLONASE) 50 MCG/ACT nasal spray Place 2 sprays into both nostrils daily. 07/11/18  Yes [provider]  omeprazole (PRILOSEC) 20 MG capsule Take 1 capsule by mouth daily.  10/17/17  Yes [provider]  sodium chloride 1 g tablet Take 1 g by mouth 3 (three) times daily. 04/19/18  Yes [provider]  Travoprost, BAK Free, (TRAVATAN) 0.004 % SOLN ophthalmic solution Place 1 drop into the left eye at bedtime.   Yes [provider]  vitamin B-12 (CYANOCOBALAMIN) 500 MCG tablet Take 1,000 mcg by mouth daily.   Yes [provider]  vitamin C (ASCORBIC ACID) 500 MG tablet Take 500 mg by mouth daily.   Yes [provider]      VITAL SIGNS:  Blood pressure (!) 180/79, pulse 89, temperature 98.1 F (36.7 C), temperature source Oral, resp. rate (!) 22, height 4\' 8"  (1.422 m), weight 55.3 kg, SpO2 96 %.  PHYSICAL EXAMINATION:  Physical Exam  GENERAL:  85 y.o.-year-old pleasant Caucasian female patient lying in the bed with no acute distress.  EYES: Pupils equal, round, reactive to light and accommodation. No scleral icterus. Extraocular muscles intact.  HEENT: Head atraumatic, normocephalic. Oropharynx and nasopharynx clear.  NECK:  Supple, no jugular venous distention. No thyroid enlargement, no tenderness.  LUNGS: Normal breath sounds bilaterally, no wheezing, rales,rhonchi or crepitation. No use of accessory muscles of respiration.  CARDIOVASCULAR: Regular rate and rhythm, S1, S2 normal. No murmurs, rubs, or gallops.  ABDOMEN: Soft, nondistended, nontender. Bowel sounds present. No organomegaly or mass.  EXTREMITIES: No pedal edema, cyanosis, or clubbing.  NEUROLOGIC: Cranial nerves II through XII are intact. Muscle strength 5/5 in all extremities except left lower extremity 4/5.  Sensation intact. Gait not checked.  PSYCHIATRIC: The patient is alert and oriented x 3.  Normal affect and good eye contact. SKIN: No obvious rash, lesion, or ulcer.    LABORATORY PANEL:   CBC Recent Labs  Lab 11/03/19 2035  WBC 4.3  HGB 12.8  HCT 37.2  PLT 183   ------------------------------------------------------------------------------------------------------------------  Chemistries  Recent Labs  Lab 11/03/19 2035  NA 133*  K 3.3*  CL 101  CO2 26  GLUCOSE 171*  BUN 11  CREATININE 0.70  CALCIUM 8.7*  AST 29  ALT 14  ALKPHOS 94  BILITOT 0.5   ------------------------------------------------------------------------------------------------------------------  Cardiac Enzymes No results for input(s): TROPONINI in the last 168 hours. ------------------------------------------------------------------------------------------------------------------  RADIOLOGY:  Ct Head Wo Contrast  Result Date: 11/03/2019 CLINICAL DATA:  Altered level of consciousness. Near syncope. EXAM: CT HEAD WITHOUT CONTRAST TECHNIQUE: Contiguous axial images were obtained from the base of the skull through the vertex without intravenous contrast. COMPARISON:  Head CT 11/19/2017, brain MRI 11/20/2017 FINDINGS: Brain: No intracranial hemorrhage, mass effect, or midline shift. Brain volume is normal for age. Unchanged chronic small vessel ischemia. No hydrocephalus. The basilar cisterns are patent. No evidence of territorial infarct or acute ischemia. No extra-axial or intracranial fluid collection. Vascular: Atherosclerosis of skullbase vasculature without hyperdense vessel or abnormal calcification.  Skull: No fracture or focal lesion. Sinuses/Orbits: Paranasal sinuses and mastoid air cells are clear. The visualized orbits are unremarkable. Bilateral lens extraction. Other: None. IMPRESSION: 1. No acute intracranial abnormality. 2. Unchanged age related atrophy and chronic small vessel ischemia. Electronically Signed   By: Keith Rake M.D.   On: 11/03/2019 23:04   Dg Chest Portable 1 View  Result Date: 11/03/2019 CLINICAL DATA:  85 year old female with near  syncope. EXAM: PORTABLE CHEST 1 VIEW COMPARISON:  Chest radiograph dated 01/30/2019. FINDINGS: Mild diffuse interstitial prominence. No focal consolidation, pleural effusion, or pneumothorax. Stable cardiac silhouette. No acute osseous pathology. Osteopenia. IMPRESSION: 1. No acute cardiopulmonary process. 2. Stable mild interstitial prominence. Electronically Signed   By: Anner Crete M.D.   On: 11/03/2019 21:08      IMPRESSION AND PLAN:   1.  Presyncope.The patient will be admitted to an observation medically monitored bed.  Will check orthostatics q 12 hours.  Will obtain a bilateral carotid Doppler and 2D echo.  The patient will be gently hydrated with IV normal saline and monitored for arrhythmias. Differential diagnoses would include neurally mediated syncope, cardiogenic, arrhythmias related,  orthostatic hypotension and less likely hypoglycemia.  2.  Altered mental status with confusion and left-sided heaviness with left leg weakness and elevated blood pressure/hypertensive urgency, rule out TIA/evolving CVA.  Work-up as above and will obtain brain MRI without contrast.  The patient will be placed on aspirin as well as statin therapy and will check her fasting lipids.  PT/OT consult will be obtained.  Permissive hypertension will be allowed and she will be placed on as needed IV labetalol.  3.  Glaucoma.  Her ophthalmic GTT will be continued.  4.  GERD.  PPI therapy will be resumed.  5.  DVT prophylaxis.  Subcutaneous Lovenox   All the records are reviewed and case discussed with ED provider. The plan of care was discussed in details with the patient (and family). I answered all questions. The patient agreed to proceed with the above mentioned plan. Further management will depend upon hospital course.   CODE STATUS: Full code  TOTAL TIME TAKING CARE OF THIS PATIENT: 50 minutes.    Christel Mormon M.D on 11/04/2019 at 2:07 AM  [facility]   From 7 PM-7 AM, contact  night-coverage [email or web address]  CC: Primary care physician; Rusty Aus, MD   Note: This dictation was prepared with Dragon dictation along with smaller phrase technology. Any transcriptional errors that result from this process are unintentional.

## 2019-11-04 NOTE — Consult Note (Addendum)
Reason for Consult:L sided weakness  Referring Physician: Dr. Loleta Books   CC: L sided heaviness/weakness   HPI: Robin Fernandez is an 85 y.o. female with a known history of  glaucoma and GERD, who presented to the emergency room with acute onset of lightheadedness and dizziness associated with confusion. Upon presentation to the emergency room, blood pressure was 208/84. On examination she states she has subjective LUE and LLE weakness along with L facial droop. Mentation  Improved.  Pt was on ASA 81mg  daily prior to admission.   Past Medical History:  Diagnosis Date  . GERD (gastroesophageal reflux disease)   . Glaucoma   . Vasovagal syncope     Past Surgical History:  Procedure Laterality Date  . ABDOMINAL HYSTERECTOMY    . EYE SURGERY      History reviewed. No pertinent family history.  Social History:  reports that she has quit smoking. Her smoking use included cigarettes. She has never used smokeless tobacco. She reports that she does not drink alcohol or use drugs.  Allergies  Allergen Reactions  . Salicylates Hives    Aspirin-like analgesic, salicylates group--urticaria  . Alendronate Other (See Comments)  . Amoxil [Amoxicillin] Other (See Comments)    Diarrhea, Blood in Stool  . Azopt [Brinzolamide] Other (See Comments)    Tingling in arms and feet  . Deltasone [Prednisone]     Affects blood pressure  . Erythromycin Other (See Comments)    Diarrhea, Blood in Stool   . Lumigan [Bimatoprost]     Nervousness  . Meloxicam Other (See Comments)  . Ofloxacin Other (See Comments)  . Parafon Forte Dsc [Chlorzoxazone] Other (See Comments)    "made very sick"  . Prevacid [Lansoprazole] Swelling    Medications: I have reviewed the patient's current medications.  ROS: History obtained from the patient  General ROS: negative for - chills, fatigue, fever, night sweats, weight gain or weight loss Psychological ROS: negative for - behavioral disorder, hallucinations, memory  difficulties, mood swings or suicidal ideation Ophthalmic ROS: negative for - blurry vision, double vision, eye pain or loss of vision ENT ROS: negative for - epistaxis, nasal discharge, oral lesions, sore throat, tinnitus or vertigo Allergy and Immunology ROS: negative for - hives or itchy/watery eyes Hematological and Lymphatic ROS: negative for - bleeding problems, bruising or swollen lymph nodes Endocrine ROS: negative for - galactorrhea, hair pattern changes, polydipsia/polyuria or temperature intolerance Respiratory ROS: negative for - cough, hemoptysis, shortness of breath or wheezing Cardiovascular ROS: negative for - chest pain, dyspnea on exertion, edema or irregular heartbeat Gastrointestinal ROS: negative for - abdominal pain, diarrhea, hematemesis, nausea/vomiting or stool incontinence Genito-Urinary ROS: negative for - dysuria, hematuria, incontinence or urinary frequency/urgency Musculoskeletal ROS: negative for - joint swelling or muscular weakness Neurological ROS: as noted in HPI Dermatological ROS: negative for rash and skin lesion changes  Physical Examination: Blood pressure (!) 136/49, pulse (!) 104, temperature 97.8 F (36.6 C), temperature source Oral, resp. rate 20, height 4\' 8"  (1.422 m), weight 55.3 kg, SpO2 97 %.   Neurological Examination   Mental Status: Alert, oriented, thought content appropriate.  Speech fluent without evidence of aphasia.  Able to follow 3 step commands without difficulty. Cranial Nerves: II: Discs flat bilaterally; Visual fields grossly normal, pupils equal, round, reactive to light and accommodation III,IV, VI: ptosis not present, extra-ocular motions intact bilaterally V,VII: L facial droop VIII: hearing normal bilaterally IX,X: gag reflex present XI: bilateral shoulder shrug XII: midline tongue extension Motor: Right : Upper  extremity   5/5    Left:     Upper extremity   4/5  Lower extremity   5/5     Lower extremity   3/5 Tone  and bulk:normal tone throughout; no atrophy noted Sensory: subjective numbness on L side  Deep Tendon Reflexes: 2+ and symmetric throughout Plantars: Right: downgoing   Left: downgoing Cerebellar: normal finger-to-nose, normal rapid alternating movements and normal heel-to-shin test Gait: not tested      Laboratory Studies:   Basic Metabolic Panel: Recent Labs  Lab 11/03/19 2035 11/04/19 0547  NA 133* 132*  K 3.3* 3.8  CL 101 99  CO2 26 26  GLUCOSE 171* 129*  BUN 11 7*  CREATININE 0.70 0.42*  CALCIUM 8.7* 8.4*    Liver Function Tests: Recent Labs  Lab 11/03/19 2035  AST 29  ALT 14  ALKPHOS 94  BILITOT 0.5  PROT 6.6  ALBUMIN 3.6   No results for input(s): LIPASE, AMYLASE in the last 168 hours. No results for input(s): AMMONIA in the last 168 hours.  CBC: Recent Labs  Lab 11/03/19 2035 11/04/19 0547  WBC 4.3 5.0  HGB 12.8 12.7  HCT 37.2 37.6  MCV 91.2 93.5  PLT 183 197    Cardiac Enzymes: No results for input(s): CKTOTAL, CKMB, CKMBINDEX, TROPONINI in the last 168 hours.  BNP: Invalid input(s): POCBNP  CBG: No results for input(s): GLUCAP in the last 168 hours.  Microbiology: Results for orders placed or performed during the hospital encounter of 11/03/19  SARS CORONAVIRUS 2 (TAT 6-24 HRS) Nasopharyngeal Nasopharyngeal Swab     Status: None   Collection Time: 11/03/19 11:29 PM   Specimen: Nasopharyngeal Swab  Result Value Ref Range Status   SARS Coronavirus 2 NEGATIVE NEGATIVE Final    Comment: (NOTE) SARS-CoV-2 target nucleic acids are NOT DETECTED. The SARS-CoV-2 RNA is generally detectable in upper and lower respiratory specimens during the acute phase of infection. Negative results do not preclude SARS-CoV-2 infection, do not rule out co-infections with other pathogens, and should not be used as the sole basis for treatment or other patient management decisions. Negative results must be combined with clinical observations, patient  history, and epidemiological information. The expected result is Negative. Fact Sheet for Patients: SugarRoll.be Fact Sheet for Healthcare Providers: https://www.woods-mathews.com/ This test is not yet approved or cleared by the [place] FDA and  has been authorized for detection and/or diagnosis of SARS-CoV-2 by FDA under an Emergency Use Authorization (EUA). This EUA will remain  in effect (meaning this test can be used) for the duration of the COVID-19 declaration under Section 56 4(b)(1) of the Act, 21 U.S.C. section 360bbb-3(b)(1), unless the authorization is terminated or revoked sooner. Performed at [facility], [place] [address], [place], [place] [address]     Coagulation Studies: No results for input(s): LABPROT, INR in the last 72 hours.  Urinalysis:  Recent Labs  Lab 11/03/19 2035  COLORURINE STRAW*  LABSPEC 1.009  PHURINE 6.0  GLUCOSEU NEGATIVE  HGBUR NEGATIVE  BILIRUBINUR NEGATIVE  KETONESUR NEGATIVE  PROTEINUR NEGATIVE  NITRITE NEGATIVE  LEUKOCYTESUR NEGATIVE    Lipid Panel:     Component Value Date/Time   CHOL 162 11/04/2019 0547   TRIG 47 11/04/2019 0547   HDL 36 (L) 11/04/2019 0547   CHOLHDL 4.5 11/04/2019 0547   VLDL 9 11/04/2019 0547   LDLCALC 117 (H) 11/04/2019 0547    HgbA1C:  Lab Results  Component Value Date   HGBA1C 5.5 11/20/2017  Urine Drug Screen:  No results found for: LABOPIA, COCAINSCRNUR, LABBENZ, AMPHETMU, THCU, LABBARB  Alcohol Level: No results for input(s): ETH in the last 168 hours.  Other results: EKG: normal EKG, normal sinus rhythm, unchanged from previous tracings.  Imaging: Ct Head Wo Contrast  Result Date: 11/03/2019 CLINICAL DATA:  Altered level of consciousness. Near syncope. EXAM: CT HEAD WITHOUT CONTRAST TECHNIQUE: Contiguous axial images were obtained from the base of the skull through the vertex without intravenous contrast. COMPARISON:  Head CT  11/19/2017, brain MRI 11/20/2017 FINDINGS: Brain: No intracranial hemorrhage, mass effect, or midline shift. Brain volume is normal for age. Unchanged chronic small vessel ischemia. No hydrocephalus. The basilar cisterns are patent. No evidence of territorial infarct or acute ischemia. No extra-axial or intracranial fluid collection. Vascular: Atherosclerosis of skullbase vasculature without hyperdense vessel or abnormal calcification. Skull: No fracture or focal lesion. Sinuses/Orbits: Paranasal sinuses and mastoid air cells are clear. The visualized orbits are unremarkable. Bilateral lens extraction. Other: None. IMPRESSION: 1. No acute intracranial abnormality. 2. Unchanged age related atrophy and chronic small vessel ischemia. Electronically Signed   By: Keith Rake M.D.   On: 11/03/2019 23:04   US Carotid Bilateral  Result Date: 11/04/2019 CLINICAL DATA:  TIA symptoms, hyperlipidemia recent syncope EXAM: BILATERAL CAROTID DUPLEX ULTRASOUND TECHNIQUE: Pearline Cables scale imaging, color Doppler and duplex ultrasound were performed of bilateral carotid and vertebral arteries in the neck. COMPARISON:  11/19/2017 FINDINGS: Criteria: Quantification of carotid stenosis is based on velocity parameters that correlate the residual internal carotid diameter with NASCET-based stenosis levels, using the diameter of the distal internal carotid lumen as the denominator for stenosis measurement. The following velocity measurements were obtained: RIGHT ICA: 102/21 cm/sec CCA: [id number] cm/sec SYSTOLIC ICA/CCA RATIO:  0.9 ECA: 154 cm/sec LEFT ICA: 157/14 cm/sec CCA: [id number] cm/sec SYSTOLIC ICA/CCA RATIO:  1.4 ECA: 149 cm/sec RIGHT CAROTID ARTERY: Minor echogenic shadowing plaque formation. No hemodynamically significant right ICA stenosis, velocity elevation, or turbulent flow. Degree of narrowing less than 50%. RIGHT VERTEBRAL ARTERY:  Antegrade LEFT CAROTID ARTERY: Similar scattered minor echogenic plaque formation. No  hemodynamically significant left ICA stenosis, velocity elevation, or turbulent flow. LEFT VERTEBRAL ARTERY:  Antegrade IMPRESSION: Minor carotid atherosclerosis. No hemodynamically significant ICA stenosis. Degree of narrowing less than 50% bilaterally by ultrasound criteria. Patent antegrade vertebral flow bilaterally Electronically Signed   By: Jerilynn Mages.  Shick M.D.   On: 11/04/2019 09:02   Dg Chest Portable 1 View  Result Date: 11/03/2019 CLINICAL DATA:  85 year old female with near syncope. EXAM: PORTABLE CHEST 1 VIEW COMPARISON:  Chest radiograph dated 01/30/2019. FINDINGS: Mild diffuse interstitial prominence. No focal consolidation, pleural effusion, or pneumothorax. Stable cardiac silhouette. No acute osseous pathology. Osteopenia. IMPRESSION: 1. No acute cardiopulmonary process. 2. Stable mild interstitial prominence. Electronically Signed   By: Anner Crete M.D.   On: 11/03/2019 21:08     Assessment/Plan:  85 y.o. female with a known history of  glaucoma and GERD, who presented to the emergency room with acute onset of lightheadedness and dizziness associated with confusion. Upon presentation to the emergency room, blood pressure was 208/84. On examination she states she has subjective LUE and LLE weakness along with L facial droop. Mentation  Improved.  Pt was on ASA 81mg  daily prior to admission.   - Increase ASA to 325 daily - Suspect small lacunar infarct given the L sided weakness and in setting of HTN - MRI/MRA pending  - Pt/Ot   Addendum: R BG lacunar infarct in setting of small vessel  disease in setting of HTN - ASA 325 daily as no significant intracranial stenosis - pt/ot 11/04/2019, 10:38 AM

## 2019-11-04 NOTE — Progress Notes (Signed)
PT Cancellation Note  Patient Details Name: Robin Fernandez MRN: [id number] DOB: 11-23-1929   Cancelled Treatment:    Reason Eval/Treat Not Completed: Patient receiving procedure in room during PT eval attempt.  Will attempt to see pt at a future date/time as medically appropriate.     Linus Salmons PT, DPT 11/04/19, 5:18 PM

## 2019-11-04 NOTE — ED Notes (Addendum)
Pt transported to MRI, pt unhooked from NS at this time. Will inform RN Maudie Mercury

## 2019-11-04 NOTE — ED Notes (Signed)
This EDT and Dorian EDT transferred pt to hospital bed

## 2019-11-04 NOTE — ED Notes (Signed)
Resting with eyes closed, monitor SR with brief runs of SVT which resolve spontaneously. States feels "right bad" but unable to be specific. Returns to resting with eyes closed when undisturbed.

## 2019-11-04 NOTE — Evaluation (Signed)
Occupational Therapy Evaluation Patient Details Name: Robin Fernandez MRN: [id number] DOB: 11-08-1929 Today's Date: 11/04/2019    History of Present Illness 85 y.o. female with a known history of  glaucoma and GERD, who presented to the emergency room with acute onset of lightheadedness and dizziness associated with confusion. Upon presentation to the emergency room, blood pressure was 208/84. On examination she states she has subjective LUE and LLE weakness along with L facial droop. MRI indicates acute infarct.   Clinical Impression   Pt seen for OT evaluation this date. Prior to hospital admission, pt was independent in all aspects of ADL and mobility, driving, and denies falls in past 12 months. Pt lives by herself in a 1 story home with ramped entrance and daughter available PRN (works; present for evaluation). Currently pt demonstrates impairments in LUE/LLE strength, coordination, and sensation. Given impairments, balance likely challenged. Pt politely declining OOB or EOB ADL activity assessment 2/2 fatigue and despite education into benefits of OT at this time. Pt reports eager to participate but has not slept much since 8pm the previous date since coming to the hospital. Pt is eager to return to his PLOF with increased independence and functional use of her L arm and leg. No cognitive or visual deficits noted with assessment on this date. L facial droop noted. Pt would benefit from skilled OT to address noted impairments and functional limitations (see below for any additional details) in order to maximize safety and independence while minimizing falls risk and caregiver burden.  Upon hospital discharge, recommend pt discharge to SNF to maximize safety and return to PLOF. Will continue to assess as pt progresses.    Follow Up Recommendations  SNF    Equipment Recommendations  3 in 1 bedside commode    Recommendations for Other Services       Precautions / Restrictions  Precautions Precautions: Fall Restrictions Weight Bearing Restrictions: No      Mobility Bed Mobility               General bed mobility comments: pt declined 2/2 fatigue  Transfers                 General transfer comment: pt declined 2/2 fatigue    Balance                                           ADL either performed or assessed with clinical judgement   ADL Overall ADL's : Needs assistance/impaired                                       General ADL Comments: limited ADL assessment 2/2 fatigue and pt politely declining OOB and EOB attempts; given impairments, anticipate need for assist for LB ADL tasks from sit to stand position and difficulty with BUE tasks 2/2 decreased strength, coordination, and sensation on L side     Vision Baseline Vision/History: Wears glasses Wears Glasses: At all times Patient Visual Report: No change from baseline Vision Assessment?: No apparent visual deficits     Perception     Praxis      Pertinent Vitals/Pain Pain Assessment: No/denies pain(pt denies pain but reports "tightness" and "jerking" in LLE)     Hand Dominance Right   Extremity/Trunk Assessment Upper Extremity Assessment Upper Extremity  Assessment: LUE deficits/detail(RUE WFL age appropriate) LUE Deficits / Details: strength grossly 3+/5, decreased strength, FMC, and sensation with testing LUE Sensation: decreased light touch LUE Coordination: decreased fine motor;decreased gross motor   Lower Extremity Assessment Lower Extremity Assessment: LLE deficits/detail(RLE WFL age appropriate) LLE Deficits / Details: at least 3/5, decreased FMC, strength, and sensation with testing, decreased DF with pt reporting "tightness" LLE Sensation: decreased light touch LLE Coordination: decreased fine motor;decreased gross motor       Communication Communication Communication: HOH   Cognition Arousal/Alertness: Awake/alert Behavior  During Therapy: WFL for tasks assessed/performed Overall Cognitive Status: Within Functional Limits for tasks assessed                                 General Comments: alert and oriented, follows all commands   General Comments       Exercises     Shoulder Instructions      Home Living Family/patient expects to be discharged to:: Private residence Living Arrangements: Alone Available Help at Discharge: Family;Available PRN/intermittently(daughter works) Type of Home: House Home Access: Ramped entrance     Home Layout: One level     Bathroom Shower/Tub: Tub/shower unit;Walk-in shower   Bathroom Toilet: Handicapped height Bathroom Accessibility: Yes              Prior Functioning/Environment Level of Independence: Independent        Comments: Pt reports (dtr confirms) indep in mobility, driving, ADL , and IADL; has someone come clean the house monthly; denies falls        OT Problem List: Decreased strength;Decreased coordination;Impaired sensation;Decreased activity tolerance;Impaired balance (sitting and/or standing);Decreased knowledge of use of DME or AE;Impaired UE functional use      OT Treatment/Interventions: Self-care/ADL training;Therapeutic exercise;Therapeutic activities;Neuromuscular education;DME and/or AE instruction;Patient/family education;Balance training    OT Goals(Current goals can be found in the care plan section) Acute Rehab OT Goals Patient Stated Goal: to go home OT Goal Formulation: With patient/family Time For Goal Achievement: 11/18/19 Potential to Achieve Goals: Good ADL Goals Pt Will Perform Lower Body Dressing: with min guard assist;sit to/from stand Pt Will Transfer to Toilet: with min guard assist;ambulating;bedside commode(LRAD for amb) Pt/caregiver will Perform Home Exercise Program: Left upper extremity;Independently;With written HEP provided;Increased strength(increased [facility])  OT Frequency: Min 1X/week    Barriers to D/C:            Co-evaluation              AM-PAC OT "6 Clicks" Daily Activity     Outcome Measure Help from another person eating meals?: None Help from another person taking care of personal grooming?: A Little Help from another person toileting, which includes using toliet, bedpan, or urinal?: A Lot Help from another person bathing (including washing, rinsing, drying)?: A Lot Help from another person to put on and taking off regular upper body clothing?: A Little Help from another person to put on and taking off regular lower body clothing?: A Lot 6 Click Score: 16   End of Session Nurse Communication: Mobility status  Activity Tolerance: Patient limited by fatigue Patient left: in bed;with call bell/phone within reach;with family/visitor present  OT Visit Diagnosis: Other abnormalities of gait and mobility (R26.89);Hemiplegia and hemiparesis Hemiplegia - Right/Left: Left Hemiplegia - dominant/non-dominant: Non-Dominant Hemiplegia - caused by: Cerebral infarction                [id number] OT Time Calculation (min):  20 min Charges:  OT General Charges $OT Visit: 1 Visit OT Evaluation $OT Eval Moderate Complexity: 1 Mod  Jeni Salles, MPH, MS, OTR/L ascom [phone] 11/04/19, 2:28 PM

## 2019-11-04 NOTE — ED Notes (Signed)
Pt and family given drinks

## 2019-11-04 NOTE — ED Notes (Signed)
CRITICAL LAB: [facility] is 31, [facility], Dr. Joni Fears notified, orders received    Pt updated on plan to admit; pt's daughter called

## 2019-11-04 NOTE — ED Notes (Signed)
Pt with runs of [place] new, [place] notified

## 2019-11-04 NOTE — Progress Notes (Signed)
OT Cancellation Note  Patient Details Name: Robin Fernandez MRN: [id number] DOB: 11-12-1929   Cancelled Treatment:    Reason Eval/Treat Not Completed: Patient at procedure or test/ unavailable. Order received, chart reviewed. Pt out of room for testing. Will re-attempt OT evaluation at later date/time as pt is available and medically appropriate.  Jeni Salles, MPH, MS, OTR/L ascom [phone] 11/04/19, 8:22 AM

## 2019-11-05 LAB — HEMOGLOBIN A1C
Hgb A1c MFr Bld: 5.7 % — ABNORMAL HIGH (ref 4.8–5.6)
Mean Plasma Glucose: 116.89 mg/dL

## 2019-11-05 LAB — BASIC METABOLIC PANEL
Anion gap: 8 (ref 5–15)
BUN: 7 mg/dL — ABNORMAL LOW (ref 8–23)
CO2: 24 mmol/L (ref 22–32)
Calcium: 8.7 mg/dL — ABNORMAL LOW (ref 8.9–10.3)
Chloride: 102 mmol/L (ref 98–111)
Creatinine, Ser: 0.5 mg/dL (ref 0.44–1.00)
GFR calc Af Amer: >60 mL/min (ref >60)
GFR calc non Af Amer: >60 mL/min (ref >60)
Glucose, Bld: 96 mg/dL (ref 70–99)
Potassium: 3.7 mmol/L (ref 3.5–5.1)
Sodium: 134 mmol/L — ABNORMAL LOW (ref 135–145)

## 2019-11-05 LAB — ECHOCARDIOGRAM COMPLETE
Height: 56 in
Weight: 1952 oz

## 2019-11-05 LAB — CBC
HCT: 37.2 % (ref 36.0–46.0)
Hemoglobin: 12.3 g/dL (ref 12.0–15.0)
MCH: 31.2 pg (ref 26.0–34.0)
MCHC: 33.1 g/dL (ref 30.0–36.0)
MCV: 94.4 fL (ref 80.0–100.0)
Platelets: 188 10*3/uL (ref 150–400)
RBC: 3.94 MIL/uL (ref 3.87–5.11)
RDW: 13 % (ref 11.5–15.5)
WBC: 4.7 10*3/uL (ref 4.0–10.5)
nRBC: 0 % (ref 0.0–0.2)

## 2019-11-05 NOTE — Evaluation (Signed)
Physical Therapy Evaluation Patient Details Name: Robin Fernandez MRN: [id number] DOB: 01-23-1929 Today's Date: 11/05/2019   History of Present Illness  Pt is an 85 y.o. female with a known history of  glaucoma and GERD, who presented to the emergency room with acute onset of lightheadedness and dizziness associated with confusion. Upon presentation to the emergency room, blood pressure was 208/84. On examination she states she has subjective LUE and LLE weakness along with L facial droop. MD MRI impression includes acute infarction right basal ganglia and radiating white matter tracts.    Clinical Impression  Pt presented with deficits in strength, transfers, mobility, gait, balance, and activity tolerance.  Per below pt presented with L-sided weakness and deficits in motor control/sensation for both the LUE and LLE compared to the RUE/RLE but deficits mostly minor and appear to be improving compared to yesterday per chart review.  No visual deficits noted. Pt initially required constant min A to prevent posterior LOB upon standing but after anterior weight shifting activities the pt's standing balance improved considerably.  She did require occasional min A and on one occasion a heavy Mod A during amb for stability but overall her walking balance improved as the session progressed.  Per daughter the pt will have 24/7 assist upon return home.  Pt will benefit from HHPT services upon discharge to safely address above deficits for decreased caregiver assistance and eventual return to PLOF.       Follow Up Recommendations Home health PT;Supervision/Assistance - 24 hour    Equipment Recommendations  3in1 (PT);Rolling walker with 5" wheels(Youth walker)    Recommendations for Other Services       Precautions / Restrictions Precautions Precautions: Fall Restrictions Weight Bearing Restrictions: No      Mobility  Bed Mobility Overal bed mobility: Needs Assistance Bed Mobility: Supine to  Sit;Sit to Supine     Supine to sit: Min assist Sit to supine: Min assist   General bed mobility comments: Min A for BLE and trunk control  Transfers Overall transfer level: Needs assistance Equipment used: Rolling walker (2 wheeled) Transfers: Sit to/from Stand Sit to Stand: Min assist         General transfer comment: Min A to prevent posterior LOB upon standing with ankle strategy utilized but insuffecient to correct; static standing balance improved with anterior weight shifting activities  Ambulation/Gait Ambulation/Gait assistance: Mod assist Gait Distance (Feet): 40 Feet x 1, 15 Feet x 1 Assistive device: Rolling walker (2 wheeled) Gait Pattern/deviations: Step-through pattern;Decreased step length - right;Decreased step length - left;Step-to pattern Gait velocity: decreased   General Gait Details: Step-to pattern that progressed to step-through pattern with mod A to prevent LOB on one occasion but mostly CGA  Stairs            Wheelchair Mobility    Modified Rankin (Stroke Patients Only)       Balance Overall balance assessment: Needs assistance Sitting-balance support: Feet supported;Bilateral upper extremity supported Sitting balance-Leahy Scale: Fair     Standing balance support: Bilateral upper extremity supported;During functional activity Standing balance-Leahy Scale: Poor Standing balance comment: Posterior instability in static standing and Mod A to prevent LOB during amb                             Pertinent Vitals/Pain Pain Assessment: No/denies pain    Home Living Family/patient expects to be discharged to:: Private residence Living Arrangements: Alone Available Help at Discharge: Family;Personal  care attendant;Available 24 hours/day Type of Home: House Home Access: Ramped entrance     Home Layout: One level Home Equipment: Walker - 2 wheels;Cane - quad;Cane - single point;Wheelchair - manual Additional Comments: Per pt  and daughter PCA in process of being hired and pt will have 24/7 assistance available upon return home    Prior Function Level of Independence: Independent         Comments: Pt reports (dtr confirms) indep with amb community distances without an AD, drives, Ind with ADLs and IADL; has someone come clean the house monthly; denies falls     Hand Dominance   Dominant Hand: Right    Extremity/Trunk Assessment   Upper Extremity Assessment Upper Extremity Assessment: LUE deficits/detail LUE Deficits / Details: L elbow flex and ext 4/5 compared to 4+/5 on the R; light touch intact but grossly reduced compared to the RUE, good grip strength LUE Sensation: decreased light touch LUE Coordination: decreased fine motor    Lower Extremity Assessment Lower Extremity Assessment: LLE deficits/detail LLE Deficits / Details: LLE knee flex/ext and hip flex 3+/5 compared to 4+/5 on the RLE; ankle DF and PF WFL; light touch intact but grossly reduced compared to RLE LLE Sensation: decreased light touch LLE Coordination: decreased gross motor       Communication   Communication: HOH  Cognition Arousal/Alertness: Awake/alert Behavior During Therapy: WFL for tasks assessed/performed Overall Cognitive Status: Within Functional Limits for tasks assessed                                 General Comments: alert and oriented, follows all commands      General Comments      Exercises Total Joint Exercises Ankle Circles/Pumps: Strengthening;Both;10 reps Quad Sets: Strengthening;Both;10 reps Heel Slides: AROM;Strengthening;Both;5 reps Hip ABduction/ADduction: AROM;Strengthening;Both;5 reps Straight Leg Raises: Strengthening;Both;5 reps Long Arc Quad: Strengthening;Both;10 reps Knee Flexion: Strengthening;Both;10 reps Marching in Standing: AROM;Both;10 reps;Standing Bridges: Strengthening;Both;5 reps Other Exercises Other Exercises: Anterior weight shifting in standing for  decreased posterior instability Other Exercises: Sit to/from stand transfer training from various height surfaces Other Exercises: Log roll training for sup to sit for decreased caregiver assistance   Assessment/Plan    PT Assessment Patient needs continued PT services  PT Problem List Decreased strength;Decreased balance;Decreased activity tolerance;Decreased mobility;Decreased knowledge of use of DME       PT Treatment Interventions DME instruction;Gait training;Functional mobility training;Therapeutic activities;Therapeutic exercise;Balance training;Patient/family education;Neuromuscular re-education    PT Goals (Current goals can be found in the Care Plan section)  Acute Rehab PT Goals Patient Stated Goal: To return home PT Goal Formulation: With patient Time For Goal Achievement: 11/18/19 Potential to Achieve Goals: Good    Frequency 7X/week   Barriers to discharge        Co-evaluation               AM-PAC PT "6 Clicks" Mobility  Outcome Measure Help needed turning from your back to your side while in a flat bed without using bedrails?: A Little Help needed moving from lying on your back to sitting on the side of a flat bed without using bedrails?: A Little Help needed moving to and from a bed to a chair (including a wheelchair)?: A Little Help needed standing up from a chair using your arms (e.g., wheelchair or bedside chair)?: A Little Help needed to walk in hospital room?: A Lot Help needed climbing 3-5 steps with a railing? :  A Lot 6 Click Score: 16    End of Session Equipment Utilized During Treatment: Gait belt Activity Tolerance: Patient tolerated treatment well Patient left: in bed;with call bell/phone within reach;with bed alarm set;with nursing/sitter in room;with family/visitor present Nurse Communication: Mobility status; pt declined up in chair but encouraged to get to chair with nsg later in the day.  PT Visit Diagnosis: Unsteadiness on feet  (R26.81);Difficulty in walking, not elsewhere classified (R26.2);Muscle weakness (generalized) (M62.81);Hemiplegia and hemiparesis Hemiplegia - Right/Left: Left Hemiplegia - dominant/non-dominant: Non-dominant Hemiplegia - caused by: Cerebral infarction    Time: [id number] PT Time Calculation (min) (ACUTE ONLY): 51 min   Charges:   PT Evaluation $PT Eval Moderate Complexity: 1 Mod PT Treatments $Gait Training: 8-22 mins $Therapeutic Exercise: 8-22 mins        D. Scott Miakoda Mcmillion PT, DPT 11/05/19, 1:34 PM

## 2019-11-05 NOTE — TOC Initial Note (Signed)
Transition of Care [facility]) - Initial/Assessment Note    Patient Details  Name: Robin Fernandez MRN: [id number] Date of Birth: 1929/11/10  Transition of Care [facility]) CM/SW Contact:    Shelbie Hutching, RN Phone Number: 11/05/2019, 3:27 PM  Clinical Narrative:                 Patient admitted for stroke, from home where she lives alone.  Patient's daughter is at the bedside.  Daughter, Belenda Cruise is helping to set up 24/7 care at home for the patient.  Patient will discharge home tomorrow with home health and private pay caregivers 24/7.  Daughter is also able to stay with patient.  Patient reports that she has walkers, a wheelchair, walk in shower with shower chair, ramp in the garage.  Daughter requests bedside commode for patient at home.  Bedside commode will be ordered and provided by [place], it will be brought up to the room before discharge tomorrow.  Daughter will provide transportation home tomorrow.   Patient requests [place] with [facility] has agreed to accept referral for PT and OT.  Patient is current with PCP.   Expected Discharge Plan: [place] Barriers to Discharge: Continued Medical Work up   Patient Goals and CMS Choice Patient states their goals for this hospitalization and ongoing recovery are:: to go home with home health CMS Medicare.gov Compare Post Acute Care list provided to:: Patient Choice offered to / list presented to : Patient  Expected Discharge Plan and Services Expected Discharge Plan: [place]   Discharge Planning Services: CM Consult Post Acute Care Choice: [place] arrangements for the past 2 months: [place]: PT, OT [place] Agency: [place] ([place]) Date [place]: 11/05/19 Time [place]: [place] Representative spoke with at [place]: Floydene Flock  Prior Living Arrangements/Services Living  arrangements for the past 2 months: [place] with:: Self Patient language and need for interpreter reviewed:: Yes Do you feel safe going back to the place where you live?: Yes      Need for Family Participation in Patient Care: Yes (Comment)(stroke) Care giver support system in place?: Yes (comment)   Criminal Activity/Legal Involvement Pertinent to Current Situation/Hospitalization: No - Comment as needed  Activities of Daily Living Home Assistive Devices/Equipment: Wheelchair, Environmental consultant (specify type), Cane (specify quad or straight), Grab bars in shower, Raised toilet seat with rails, Other (Comment), Scales, Transfer belt, Transfer board, Electric scooter ADL Screening (condition at time of admission) Patient's cognitive ability adequate to safely complete daily activities?: Yes Is the patient deaf or have difficulty hearing?: Yes Does the patient have difficulty seeing, even when wearing glasses/contacts?: No Does the patient have difficulty concentrating, remembering, or making decisions?: No Patient able to express need for assistance with ADLs?: Yes Does the patient have difficulty dressing or bathing?: No Independently performs ADLs?: Yes (appropriate for developmental age) Does the patient have difficulty walking or climbing stairs?: No Weakness of Legs: None Weakness of Arms/Hands: None  Permission Sought/Granted Permission sought to share information with : Case Manager, Family Supports, Other (comment) Permission granted to share information with : Yes, Verbal Permission Granted     Permission granted to share info w AGENCY: [place] granted to share info w  Relationship: daughter Belenda Cruise     Emotional Assessment Appearance:: Appears stated age Attitude/Demeanor/Rapport: Engaged Affect (typically observed): Accepting Orientation: : Oriented to Self, Oriented to Place, Oriented to  Time, Oriented to Situation Alcohol / Substance  Use: Not Applicable Psych Involvement: No (comment)  Admission diagnosis:  TIA (transient ischemic attack) [G45.9] Weakness [R53.1] Elevated troponin [R77.8] Syncope, unspecified syncope type [R55] Stroke (cerebrum) [facility]) [I63.9] Patient Active Problem List   Diagnosis Date Noted  . Altered mental status 11/04/2019  . Stroke (cerebrum) ([place]) 11/04/2019  . TIA (transient ischemic attack) 11/19/2017  . Syncope 05/04/2017   PCP:  Rusty Aus, MD Pharmacy:   [place], [place] [address] [place] [address] Phone: [phone] Fax: [phone]  [place], [place] [address] [place] [address] Phone: [phone] Fax: [phone]     Social Determinants of Health (SDOH) Interventions    Readmission Risk Interventions No flowsheet data found.

## 2019-11-05 NOTE — Progress Notes (Signed)
Subjective: Significant improvement in the strength of the LLE since yesterday.    Past Medical History:  Diagnosis Date  . GERD (gastroesophageal reflux disease)   . Glaucoma   . Vasovagal syncope     Past Surgical History:  Procedure Laterality Date  . ABDOMINAL HYSTERECTOMY    . EYE SURGERY      History reviewed. No pertinent family history.  Social History:  reports that she has quit smoking. Her smoking use included cigarettes. She has never used smokeless tobacco. She reports that she does not drink alcohol or use drugs.  Allergies  Allergen Reactions  . Salicylates Hives    Aspirin-like analgesic, salicylates group--urticaria  . Alendronate Other (See Comments)  . Amoxil [Amoxicillin] Other (See Comments)    Diarrhea, Blood in Stool  . Azopt [Brinzolamide] Other (See Comments)    Tingling in arms and feet  . Deltasone [Prednisone]     Affects blood pressure  . Erythromycin Other (See Comments)    Diarrhea, Blood in Stool   . Lumigan [Bimatoprost]     Nervousness  . Meloxicam Other (See Comments)  . Ofloxacin Other (See Comments)  . Parafon Forte Dsc [Chlorzoxazone] Other (See Comments)    "made very sick"  . Prevacid [Lansoprazole] Swelling    Medications: I have reviewed the patient's current medications.  ROS: History obtained from the patient  General ROS: negative for - chills, fatigue, fever, night sweats, weight gain or weight loss Psychological ROS: negative for - behavioral disorder, hallucinations, memory difficulties, mood swings or suicidal ideation Ophthalmic ROS: negative for - blurry vision, double vision, eye pain or loss of vision ENT ROS: negative for - epistaxis, nasal discharge, oral lesions, sore throat, tinnitus or vertigo Allergy and Immunology ROS: negative for - hives or itchy/watery eyes Hematological and Lymphatic ROS: negative for - bleeding problems, bruising or swollen lymph nodes Endocrine ROS: negative for - galactorrhea, hair  pattern changes, polydipsia/polyuria or temperature intolerance Respiratory ROS: negative for - cough, hemoptysis, shortness of breath or wheezing Cardiovascular ROS: negative for - chest pain, dyspnea on exertion, edema or irregular heartbeat Gastrointestinal ROS: negative for - abdominal pain, diarrhea, hematemesis, nausea/vomiting or stool incontinence Genito-Urinary ROS: negative for - dysuria, hematuria, incontinence or urinary frequency/urgency Musculoskeletal ROS: negative for - joint swelling or muscular weakness Neurological ROS: as noted in HPI Dermatological ROS: negative for rash and skin lesion changes  Physical Examination: Blood pressure (!) 172/67, pulse 79, temperature 98.9 F (37.2 C), temperature source Oral, resp. rate 20, height 4\' 8"  (1.422 m), weight 56.2 kg, SpO2 97 %.   Neurological Examination   Mental Status: Alert, oriented, thought content appropriate.  Speech fluent without evidence of aphasia.  Able to follow 3 step commands without difficulty. Cranial Nerves: II: Discs flat bilaterally; Visual fields grossly normal, pupils equal, round, reactive to light and accommodation III,IV, VI: ptosis not present, extra-ocular motions intact bilaterally V,VII: L facial droop VIII: hearing normal bilaterally IX,X: gag reflex present XI: bilateral shoulder shrug XII: midline tongue extension Motor: Right : Upper extremity   5/5    Left:     Upper extremity   4+/5  Lower extremity   5/5     Lower extremity   4/5 Tone and bulk:normal tone throughout; no atrophy noted Sensory: subjective numbness on L side  Deep Tendon Reflexes: 2+ and symmetric throughout Plantars: Right: downgoing   Left: downgoing Cerebellar: normal finger-to-nose Gait: not tested      Laboratory Studies:   Basic Metabolic Panel: Recent  Labs  Lab 11/03/19 2035 11/04/19 0547 11/05/19 0338  NA 133* 132* 134*  K 3.3* 3.8 3.7  CL 101 99 102  CO2 26 26 24   GLUCOSE 171* 129* 96  BUN 11  7* 7*  CREATININE 0.70 0.42* 0.50  CALCIUM 8.7* 8.4* 8.7*  MG  --  2.0  --     Liver Function Tests: Recent Labs  Lab 11/03/19 2035  AST 29  ALT 14  ALKPHOS 94  BILITOT 0.5  PROT 6.6  ALBUMIN 3.6   No results for input(s): LIPASE, AMYLASE in the last 168 hours. No results for input(s): AMMONIA in the last 168 hours.  CBC: Recent Labs  Lab 11/03/19 2035 11/04/19 0547 11/05/19 0338  WBC 4.3 5.0 4.7  HGB 12.8 12.7 12.3  HCT 37.2 37.6 37.2  MCV 91.2 93.5 94.4  PLT 183 197 188    Cardiac Enzymes: No results for input(s): CKTOTAL, CKMB, CKMBINDEX, TROPONINI in the last 168 hours.  BNP: Invalid input(s): POCBNP  CBG: No results for input(s): GLUCAP in the last 168 hours.  Microbiology: Results for orders placed or performed during the hospital encounter of 11/03/19  SARS CORONAVIRUS 2 (TAT 6-24 HRS) Nasopharyngeal Nasopharyngeal Swab     Status: None   Collection Time: 11/03/19 11:29 PM   Specimen: Nasopharyngeal Swab  Result Value Ref Range Status   SARS Coronavirus 2 NEGATIVE NEGATIVE Final    Comment: (NOTE) SARS-CoV-2 target nucleic acids are NOT DETECTED. The SARS-CoV-2 RNA is generally detectable in upper and lower respiratory specimens during the acute phase of infection. Negative results do not preclude SARS-CoV-2 infection, do not rule out co-infections with other pathogens, and should not be used as the sole basis for treatment or other patient management decisions. Negative results must be combined with clinical observations, patient history, and epidemiological information. The expected result is Negative. Fact Sheet for Patients: SugarRoll.be Fact Sheet for Healthcare Providers: https://www.woods-mathews.com/ This test is not yet approved or cleared by the [place] FDA and  has been authorized for detection and/or diagnosis of SARS-CoV-2 by FDA under an Emergency Use Authorization (EUA). This EUA will  remain  in effect (meaning this test can be used) for the duration of the COVID-19 declaration under Section 56 4(b)(1) of the Act, 21 U.S.C. section 360bbb-3(b)(1), unless the authorization is terminated or revoked sooner. Performed at [facility], [place] [address], [place], [place] [address]     Coagulation Studies: No results for input(s): LABPROT, INR in the last 72 hours.  Urinalysis:  Recent Labs  Lab 11/03/19 2035  COLORURINE STRAW*  LABSPEC 1.009  PHURINE 6.0  GLUCOSEU NEGATIVE  HGBUR NEGATIVE  BILIRUBINUR NEGATIVE  KETONESUR NEGATIVE  PROTEINUR NEGATIVE  NITRITE NEGATIVE  LEUKOCYTESUR NEGATIVE    Lipid Panel:     Component Value Date/Time   CHOL 162 11/04/2019 0547   TRIG 47 11/04/2019 0547   HDL 36 (L) 11/04/2019 0547   CHOLHDL 4.5 11/04/2019 0547   VLDL 9 11/04/2019 0547   LDLCALC 117 (H) 11/04/2019 0547    HgbA1C:  Lab Results  Component Value Date   HGBA1C 5.7 (H) 11/05/2019    Urine Drug Screen:  No results found for: LABOPIA, COCAINSCRNUR, LABBENZ, AMPHETMU, THCU, LABBARB  Alcohol Level: No results for input(s): ETH in the last 168 hours.  Other results: EKG: normal EKG, normal sinus rhythm, unchanged from previous tracings.  Imaging: Ct Head Wo Contrast  Result Date: 11/03/2019 CLINICAL DATA:  Altered level of consciousness. Near syncope.  EXAM: CT HEAD WITHOUT CONTRAST TECHNIQUE: Contiguous axial images were obtained from the base of the skull through the vertex without intravenous contrast. COMPARISON:  Head CT 11/19/2017, brain MRI 11/20/2017 FINDINGS: Brain: No intracranial hemorrhage, mass effect, or midline shift. Brain volume is normal for age. Unchanged chronic small vessel ischemia. No hydrocephalus. The basilar cisterns are patent. No evidence of territorial infarct or acute ischemia. No extra-axial or intracranial fluid collection. Vascular: Atherosclerosis of skullbase vasculature without hyperdense vessel or abnormal  calcification. Skull: No fracture or focal lesion. Sinuses/Orbits: Paranasal sinuses and mastoid air cells are clear. The visualized orbits are unremarkable. Bilateral lens extraction. Other: None. IMPRESSION: 1. No acute intracranial abnormality. 2. Unchanged age related atrophy and chronic small vessel ischemia. Electronically Signed   By: Keith Rake M.D.   On: 11/03/2019 23:04   Mr Angio Head Wo Contrast  Result Date: 11/04/2019 CLINICAL DATA:  Hemiplegia. Left leg weakness. Dizziness and confusion. EXAM: MRI HEAD WITHOUT CONTRAST MRA HEAD WITHOUT CONTRAST MRA NECK WITHOUT CONTRAST TECHNIQUE: Multiplanar, multiecho pulse sequences of the brain and surrounding structures were obtained without intravenous contrast. Angiographic images of the Circle of Willis were obtained using MRA technique without intravenous contrast. Angiographic images of the neck were obtained using MRA technique without intravenous contrast. Carotid stenosis measurements (when applicable) are obtained utilizing NASCET criteria, using the distal internal carotid diameter as the denominator. COMPARISON:  Head CT yesterday. FINDINGS: MRI HEAD FINDINGS Brain: Diffusion imaging shows acute infarction in the right basal ganglia and radiating white matter tracts measuring approximately 2 cm in size. No significant swelling. No hemorrhage. Elsewhere, there chronic small-vessel changes of the pons. No focal cerebellar insult. Cerebral hemispheres show chronic small-vessel ischemic changes of the deep and subcortical white matter. No large vessel territory infarction. No mass lesion, hemorrhage, hydrocephalus or extra-axial collection. Vascular: Major vessels at the base of the brain show flow. Skull and upper cervical spine: Negative Sinuses/Orbits: Clear/normal Other: None MRA HEAD FINDINGS Both internal carotid arteries are widely patent through the siphon regions. The anterior and middle cerebral vessels are patent without proximal  stenosis, aneurysm or vascular malformation. More distal branch vessels do show atherosclerotic irregularity. Both vertebral arteries are patent to the basilar. No basilar stenosis. Posterior circulation branch vessels are patent. Distal vessel atherosclerotic irregularity, more pronounced in the left PCA than the right, including a 50% stenosis at the P2 level on the left. MRA NECK FINDINGS Both common carotid arteries show flow to the bifurcations. No carotid bifurcation stenosis. Cervical internal carotid arteries widely patent. Vertebral artery origins poorly seen without contrast. Normal appearance from the lower neck to the foramen magnum. Vertebral arteries approximately equal in size with antegrade flow and no visible stenosis. IMPRESSION: Acute infarction and right basal ganglia and radiating white matter tracts measuring about 2 cm, consistent with lenticulostriate vessel infarction. No evidence of mass effect or hemorrhage. Chronic small-vessel ischemic changes elsewhere throughout the brain as outlined above. No large or medium vessel intracranial occlusion. Distal vessel atherosclerotic irregularity. Focal 50% stenosis of the left P2 segment of the PCA. No carotid bifurcation disease. Antegrade flow in both vertebral arteries without stenosis. Limited information available in the chest using noncontrast technique. Electronically Signed   By: Nelson Chimes M.D.   On: 11/04/2019 13:01   Mr Angio Neck Wo Contrast  Result Date: 11/04/2019 CLINICAL DATA:  Hemiplegia. Left leg weakness. Dizziness and confusion. EXAM: MRI HEAD WITHOUT CONTRAST MRA HEAD WITHOUT CONTRAST MRA NECK WITHOUT CONTRAST TECHNIQUE: Multiplanar, multiecho pulse sequences of  the brain and surrounding structures were obtained without intravenous contrast. Angiographic images of the Circle of Willis were obtained using MRA technique without intravenous contrast. Angiographic images of the neck were obtained using MRA technique without  intravenous contrast. Carotid stenosis measurements (when applicable) are obtained utilizing NASCET criteria, using the distal internal carotid diameter as the denominator. COMPARISON:  Head CT yesterday. FINDINGS: MRI HEAD FINDINGS Brain: Diffusion imaging shows acute infarction in the right basal ganglia and radiating white matter tracts measuring approximately 2 cm in size. No significant swelling. No hemorrhage. Elsewhere, there chronic small-vessel changes of the pons. No focal cerebellar insult. Cerebral hemispheres show chronic small-vessel ischemic changes of the deep and subcortical white matter. No large vessel territory infarction. No mass lesion, hemorrhage, hydrocephalus or extra-axial collection. Vascular: Major vessels at the base of the brain show flow. Skull and upper cervical spine: Negative Sinuses/Orbits: Clear/normal Other: None MRA HEAD FINDINGS Both internal carotid arteries are widely patent through the siphon regions. The anterior and middle cerebral vessels are patent without proximal stenosis, aneurysm or vascular malformation. More distal branch vessels do show atherosclerotic irregularity. Both vertebral arteries are patent to the basilar. No basilar stenosis. Posterior circulation branch vessels are patent. Distal vessel atherosclerotic irregularity, more pronounced in the left PCA than the right, including a 50% stenosis at the P2 level on the left. MRA NECK FINDINGS Both common carotid arteries show flow to the bifurcations. No carotid bifurcation stenosis. Cervical internal carotid arteries widely patent. Vertebral artery origins poorly seen without contrast. Normal appearance from the lower neck to the foramen magnum. Vertebral arteries approximately equal in size with antegrade flow and no visible stenosis. IMPRESSION: Acute infarction and right basal ganglia and radiating white matter tracts measuring about 2 cm, consistent with lenticulostriate vessel infarction. No evidence of  mass effect or hemorrhage. Chronic small-vessel ischemic changes elsewhere throughout the brain as outlined above. No large or medium vessel intracranial occlusion. Distal vessel atherosclerotic irregularity. Focal 50% stenosis of the left P2 segment of the PCA. No carotid bifurcation disease. Antegrade flow in both vertebral arteries without stenosis. Limited information available in the chest using noncontrast technique. Electronically Signed   By: Nelson Chimes M.D.   On: 11/04/2019 13:01   Mr Brain Wo Contrast  Result Date: 11/04/2019 CLINICAL DATA:  Hemiplegia. Left leg weakness. Dizziness and confusion. EXAM: MRI HEAD WITHOUT CONTRAST MRA HEAD WITHOUT CONTRAST MRA NECK WITHOUT CONTRAST TECHNIQUE: Multiplanar, multiecho pulse sequences of the brain and surrounding structures were obtained without intravenous contrast. Angiographic images of the Circle of Willis were obtained using MRA technique without intravenous contrast. Angiographic images of the neck were obtained using MRA technique without intravenous contrast. Carotid stenosis measurements (when applicable) are obtained utilizing NASCET criteria, using the distal internal carotid diameter as the denominator. COMPARISON:  Head CT yesterday. FINDINGS: MRI HEAD FINDINGS Brain: Diffusion imaging shows acute infarction in the right basal ganglia and radiating white matter tracts measuring approximately 2 cm in size. No significant swelling. No hemorrhage. Elsewhere, there chronic small-vessel changes of the pons. No focal cerebellar insult. Cerebral hemispheres show chronic small-vessel ischemic changes of the deep and subcortical white matter. No large vessel territory infarction. No mass lesion, hemorrhage, hydrocephalus or extra-axial collection. Vascular: Major vessels at the base of the brain show flow. Skull and upper cervical spine: Negative Sinuses/Orbits: Clear/normal Other: None MRA HEAD FINDINGS Both internal carotid arteries are widely patent  through the siphon regions. The anterior and middle cerebral vessels are patent without proximal stenosis, aneurysm  or vascular malformation. More distal branch vessels do show atherosclerotic irregularity. Both vertebral arteries are patent to the basilar. No basilar stenosis. Posterior circulation branch vessels are patent. Distal vessel atherosclerotic irregularity, more pronounced in the left PCA than the right, including a 50% stenosis at the P2 level on the left. MRA NECK FINDINGS Both common carotid arteries show flow to the bifurcations. No carotid bifurcation stenosis. Cervical internal carotid arteries widely patent. Vertebral artery origins poorly seen without contrast. Normal appearance from the lower neck to the foramen magnum. Vertebral arteries approximately equal in size with antegrade flow and no visible stenosis. IMPRESSION: Acute infarction and right basal ganglia and radiating white matter tracts measuring about 2 cm, consistent with lenticulostriate vessel infarction. No evidence of mass effect or hemorrhage. Chronic small-vessel ischemic changes elsewhere throughout the brain as outlined above. No large or medium vessel intracranial occlusion. Distal vessel atherosclerotic irregularity. Focal 50% stenosis of the left P2 segment of the PCA. No carotid bifurcation disease. Antegrade flow in both vertebral arteries without stenosis. Limited information available in the chest using noncontrast technique. Electronically Signed   By: Nelson Chimes M.D.   On: 11/04/2019 13:01   US Carotid Bilateral  Result Date: 11/04/2019 CLINICAL DATA:  TIA symptoms, hyperlipidemia recent syncope EXAM: BILATERAL CAROTID DUPLEX ULTRASOUND TECHNIQUE: Pearline Cables scale imaging, color Doppler and duplex ultrasound were performed of bilateral carotid and vertebral arteries in the neck. COMPARISON:  11/19/2017 FINDINGS: Criteria: Quantification of carotid stenosis is based on velocity parameters that correlate the residual  internal carotid diameter with NASCET-based stenosis levels, using the diameter of the distal internal carotid lumen as the denominator for stenosis measurement. The following velocity measurements were obtained: RIGHT ICA: 102/21 cm/sec CCA: [id number] cm/sec SYSTOLIC ICA/CCA RATIO:  0.9 ECA: 154 cm/sec LEFT ICA: 157/14 cm/sec CCA: [id number] cm/sec SYSTOLIC ICA/CCA RATIO:  1.4 ECA: 149 cm/sec RIGHT CAROTID ARTERY: Minor echogenic shadowing plaque formation. No hemodynamically significant right ICA stenosis, velocity elevation, or turbulent flow. Degree of narrowing less than 50%. RIGHT VERTEBRAL ARTERY:  Antegrade LEFT CAROTID ARTERY: Similar scattered minor echogenic plaque formation. No hemodynamically significant left ICA stenosis, velocity elevation, or turbulent flow. LEFT VERTEBRAL ARTERY:  Antegrade IMPRESSION: Minor carotid atherosclerosis. No hemodynamically significant ICA stenosis. Degree of narrowing less than 50% bilaterally by ultrasound criteria. Patent antegrade vertebral flow bilaterally Electronically Signed   By: Jerilynn Mages.  Shick M.D.   On: 11/04/2019 09:02   Dg Chest Portable 1 View  Result Date: 11/03/2019 CLINICAL DATA:  85 year old female with near syncope. EXAM: PORTABLE CHEST 1 VIEW COMPARISON:  Chest radiograph dated 01/30/2019. FINDINGS: Mild diffuse interstitial prominence. No focal consolidation, pleural effusion, or pneumothorax. Stable cardiac silhouette. No acute osseous pathology. Osteopenia. IMPRESSION: 1. No acute cardiopulmonary process. 2. Stable mild interstitial prominence. Electronically Signed   By: Anner Crete M.D.   On: 11/03/2019 21:08     Assessment/Plan:  85 y.o. female with a known history of  glaucoma and GERD, who presented to the emergency room with acute onset of lightheadedness and dizziness associated with confusion. Upon presentation to the emergency room, blood pressure was 208/84. On examination she states she has subjective LUE and LLE weakness along with L  facial droop. Mentation  Improved.  Pt was on ASA 81mg  daily prior to admission.  R BG lacunar infarct in setting of small vessel disease in setting of HTN. No significant intracranial stenosis.  - ASA 325 daily as no significant intracranial stenosis - pt/ot appreciated - d/c planning  -11/05/2019, 10:50 AM

## 2019-11-05 NOTE — Progress Notes (Signed)
OT Cancellation Note  Patient Details Name: PRICILLIA PONDEXTER MRN: [id number] DOB: [birth year]   Cancelled Treatment:    Reason Eval/Treat Not Completed: Other (comment). Pt working with PT upon attempt. Will re-attempt OT tx at later time as pt is available and medically appropriate.   Jeni Salles, MPH, MS, OTR/L ascom [phone] 11/05/19, 10:52 AM

## 2019-11-05 NOTE — Progress Notes (Signed)
Occupational Therapy Treatment Patient Details Name: Robin Fernandez MRN: [id number] DOB: 1928-12-26 Today's Date: 11/05/2019    History of present illness Pt is an 85 y.o. female with a known history of  glaucoma and GERD, who presented to the emergency room with acute onset of lightheadedness and dizziness associated with confusion. Upon presentation to the emergency room, blood pressure was 208/84. On examination she states she has subjective LUE and LLE weakness along with L facial droop. MD MRI impression includes acute infarction right basal ganglia and radiating white matter tracts.   OT comments  Pt seen for OT tx this date. Pt eager to participate; reports feeling some improvement in L side weakness toda. Pt instructed in BUE theraband ex with handout provided; instructed in [facility] ex and activities for LUE with handout provided; pt verbalized understanding and able to return demo proper technique. Discharge rec updated to reflect pt's progress and current functional status. RNCM notified. Pt continues to benefit from skilled OT services.    Follow Up Recommendations  Home health OT    Equipment Recommendations  3 in 1 bedside commode    Recommendations for Other Services      Precautions / Restrictions Precautions Precautions: Fall Restrictions Weight Bearing Restrictions: No       Mobility   Balance    ADL either performed or assessed with clinical judgement   ADL Overall ADL's : Needs assistance/impaired                                       General ADL Comments: Pt requires Min-mod A for LB ADL and at least CGA for functional ADL mobility     Vision Baseline Vision/History: Wears glasses Wears Glasses: At all times Patient Visual Report: No change from baseline Vision Assessment?: No apparent visual deficits   Perception     Praxis      Cognition Arousal/Alertness: Awake/alert Behavior During Therapy: WFL for tasks  assessed/performed Overall Cognitive Status: Within Functional Limits for tasks assessed                                 General Comments: alert and oriented, follows all commands        Exercises  Other Exercises: Pt instructed in BUE theraband ex with handout provided; instructed in [facility] ex and activities for LUE with handout provided; pt verbalized understanding and able to return demo proper technique   Shoulder Instructions       General Comments      Pertinent Vitals/ Pain       Pain Assessment: No/denies pain  Home Living Family/patient expects to be discharged to:: Private residence Living Arrangements: Alone Available Help at Discharge: Family;Personal care attendant;Available 24 hours/day Type of Home: House Home Access: Ramped entrance     Home Layout: One level     Bathroom Shower/Tub: Tub/shower unit;Walk-in shower   Bathroom Toilet: Handicapped height Bathroom Accessibility: Yes   Home Equipment: Walker - 2 wheels;Cane - quad;Cane - single point;Wheelchair - manual   Additional Comments: Per pt and daughter PCA in process of being hired and pt will have 24/7 assistance available upon return home      Prior Functioning/Environment Level of Independence: Independent        Comments: Pt reports (dtr confirms) indep with amb community distances without an AD, drives, Ind with  ADLs and IADL; has someone come clean the house monthly; denies falls   Frequency  Min 1X/week        Progress Toward Goals  OT Goals(current goals can now be found in the care plan section)  Progress towards OT goals: Progressing toward goals  Acute Rehab OT Goals Patient Stated Goal: To return home OT Goal Formulation: With patient/family Time For Goal Achievement: 11/18/19 Potential to Achieve Goals: Good  Plan Frequency remains appropriate;Discharge plan needs to be updated    Co-evaluation                 AM-PAC OT "6 Clicks" Daily Activity      Outcome Measure   Help from another person eating meals?: None Help from another person taking care of personal grooming?: A Little Help from another person toileting, which includes using toliet, bedpan, or urinal?: A Little Help from another person bathing (including washing, rinsing, drying)?: A Lot Help from another person to put on and taking off regular upper body clothing?: A Little Help from another person to put on and taking off regular lower body clothing?: A Lot 6 Click Score: 17    End of Session    OT Visit Diagnosis: Other abnormalities of gait and mobility (R26.89);Hemiplegia and hemiparesis Hemiplegia - Right/Left: Left Hemiplegia - dominant/non-dominant: Non-Dominant Hemiplegia - caused by: Cerebral infarction   Activity Tolerance Patient tolerated treatment well   Patient Left in bed;with call bell/phone within reach;with bed alarm set   Nurse Communication          Time: [id number] OT Time Calculation (min): 25 min  Charges: OT General Charges $OT Visit: 1 Visit OT Treatments $Neuromuscular Re-education: 8-22 mins $Therapeutic Exercise: 8-22 mins  Jeni Salles, MPH, MS, OTR/L ascom [phone] 11/05/19, 3:33 PM

## 2019-11-05 NOTE — Progress Notes (Signed)
PROGRESS NOTE    Robin Fernandez  [id number] DOB: 10/01/1929 DOA: 11/03/2019 PCP: Rusty Aus, MD      Brief Narrative:  Robin Fernandez is a 85 y.o. F with glaucoma who presents with acute dizziness and presyncope.    In the ER, she noted left sided "heaviness" and motor weakness.  BP 208/84, head CT normal.        Assessment & Plan:  Acute right basal ganglia ischemic stroke MRI shows 2 cm right basal ganglia and radiating down white matter tracts infarct.  MRA of the head and neck shows no significant atherosclerosis.  Likely small vessel stroke in the setting of hypertension. -Echocardiogram pending -Carotid ultrasound less than 70% stenosis -Lipids ordered: LDL 117; replaced simvastatin 20 --> continue atorvastatin 40 -Continue aspirin 325 -Allow permissive hypertension -Atrial fibrillation: None on monitor -Dysphagia screen ordered in ER -PT eval ordered   Hypertensive urgency Not previously on antihypertensives.  Blood pressure greater than 200 on admission.  Blood pressure remains 140s to 170s. -Continue permissive hypertension -As needed labetalol for blood pressure greater than 190/110 -At discharge with plan to start antihypertensives in order to normalize blood pressure within 5 to 7 days  Ventricular tachycardia Had short runs of VT on monitor yesterday.  K and mag normal.  Asymptomatic, hemodynamically stable.  No further V. tach today. -Keep K>4, mag>2  Hyponatremia Mild, asymptomatic, chronic  Glaucoma -Continue eye drops  GER -Continue PPI         DVT prophylaxis: Lovenox Code Status: FULL Family Communication:  MDM and disposition Plan:  The below labs and imaging reports reviewed and summarized above.  Medication management as above.     The patient was admitted with acute right basal ganglia stroke.  She is highly motivated to return home.  Once her stroke work-up is complete with echocardiogram report this evening and PT  evaluation, we will plan for discharge either to home or to SNF.  Likely tomorrow.    Objective: Vitals:   11/05/19 0007 11/05/19 0414 11/05/19 0500 11/05/19 0958  BP: (!) 161/67 (!) 141/76  (!) 172/67  Pulse: 78 89  79  Resp: 17 16  20   Temp: 99 F (37.2 C) 99.6 F (37.6 C)  98.9 F (37.2 C)  TempSrc: Oral Oral  Oral  SpO2: 94% 91%  97%  Weight:   56.2 kg   Height:        Intake/Output Summary (Last 24 hours) at 11/05/2019 1316 Last data filed at 11/05/2019 0900 Gross per 24 hour  Intake 240 ml  Output 200 ml  Net 40 ml   Filed Weights   11/03/19 2018 11/05/19 0500  Weight: 55.3 kg 56.2 kg    Examination: General appearance: Small elderly adult female, alert and in no acute distress.   HEENT: Anicteric, conjunctiva pink, lids and lashes normal. No nasal deformity, discharge, epistaxis.  Lips moist, teeth normal. OP tacky dry, no oral lesions.   Skin: Warm and dry.  No suspicious rashes or lesions. Cardiac: RRR, no murmurs appreciated.  No LE edema.    Respiratory: Normal respiratory rate and rhythm.  CTAB without rales or wheezes. Abdomen: Abdomen soft.  No tenderness palpation or guarding. No ascites, distension, hepatosplenomegaly.   MSK: No deformities or effusions of the large joints of the upper or lower extremities bilaterally.  Diffuse loss of subcutaneous muscle mass and fat, appropriate for age Neuro: Awake and alert. Naming is grossly intact, and the patient's recall, recent and remote, as  well as general fund of knowledge seem within normal limits.  Muscle tone diminished diffusely, without fasciculations.  4/5 strength in the left leg, 5/5 strength on the right leg.  4+/5 strength in the left arm, 5 5 strength in the right.  Left facial droop.  Moves all extremities equally and with normal coordination.  Marland Kitchen Speech fluent.    Psych: Sensorium intact and responding to questions, attention normal. Affect normal.  Judgment and insight appear normal.      Data  Reviewed: I have personally reviewed following labs and imaging studies:  CBC: Recent Labs  Lab 11/03/19 2035 11/04/19 0547 11/05/19 0338  WBC 4.3 5.0 4.7  HGB 12.8 12.7 12.3  HCT 37.2 37.6 37.2  MCV 91.2 93.5 94.4  PLT 183 197 0000000   Basic Metabolic Panel: Recent Labs  Lab 11/03/19 2035 11/04/19 0547 11/05/19 0338  NA 133* 132* 134*  K 3.3* 3.8 3.7  CL 101 99 102  CO2 26 26 24   GLUCOSE 171* 129* 96  BUN 11 7* 7*  CREATININE 0.70 0.42* 0.50  CALCIUM 8.7* 8.4* 8.7*  MG  --  2.0  --    GFR: Estimated Creatinine Clearance: 34 mL/min (by C-G formula based on SCr of 0.5 mg/dL). Liver Function Tests: Recent Labs  Lab 11/03/19 2035  AST 29  ALT 14  ALKPHOS 94  BILITOT 0.5  PROT 6.6  ALBUMIN 3.6   No results for input(s): LIPASE, AMYLASE in the last 168 hours. No results for input(s): AMMONIA in the last 168 hours. Coagulation Profile: No results for input(s): INR, PROTIME in the last 168 hours. Cardiac Enzymes: No results for input(s): CKTOTAL, CKMB, CKMBINDEX, TROPONINI in the last 168 hours. BNP (last 3 results) No results for input(s): PROBNP in the last 8760 hours. HbA1C: Recent Labs    11/05/19 0338  HGBA1C 5.7*   CBG: No results for input(s): GLUCAP in the last 168 hours. Lipid Profile: Recent Labs    11/04/19 0547  CHOL 162  HDL 36*  LDLCALC 117*  TRIG 47  CHOLHDL 4.5   Thyroid Function Tests: No results for input(s): TSH, T4TOTAL, FREET4, T3FREE, THYROIDAB in the last 72 hours. Anemia Panel: No results for input(s): VITAMINB12, FOLATE, FERRITIN, TIBC, IRON, RETICCTPCT in the last 72 hours. Urine analysis:    Component Value Date/Time   COLORURINE STRAW (A) 11/03/2019 2035   APPEARANCEUR CLEAR (A) 11/03/2019 2035   APPEARANCEUR Hazy 07/06/2014 1426   LABSPEC 1.009 11/03/2019 2035   LABSPEC 1.017 07/06/2014 1426   PHURINE 6.0 11/03/2019 2035   GLUCOSEU NEGATIVE 11/03/2019 2035   GLUCOSEU Negative 07/06/2014 1426   HGBUR NEGATIVE  11/03/2019 2035   BILIRUBINUR NEGATIVE 11/03/2019 2035   BILIRUBINUR Negative 07/06/2014 1426   KETONESUR NEGATIVE 11/03/2019 2035   PROTEINUR NEGATIVE 11/03/2019 2035   NITRITE NEGATIVE 11/03/2019 2035   LEUKOCYTESUR NEGATIVE 11/03/2019 2035   LEUKOCYTESUR 3+ 07/06/2014 1426   Sepsis Labs: @LABRCNTIP (procalcitonin:4,lacticacidven:4)  ) Recent Results (from the past 240 hour(s))  SARS CORONAVIRUS 2 (TAT 6-24 HRS) Nasopharyngeal Nasopharyngeal Swab     Status: None   Collection Time: 11/03/19 11:29 PM   Specimen: Nasopharyngeal Swab  Result Value Ref Range Status   SARS Coronavirus 2 NEGATIVE NEGATIVE Final    Comment: (NOTE) SARS-CoV-2 target nucleic acids are NOT DETECTED. The SARS-CoV-2 RNA is generally detectable in upper and lower respiratory specimens during the acute phase of infection. Negative results do not preclude SARS-CoV-2 infection, do not rule out co-infections with other pathogens, and  should not be used as the sole basis for treatment or other patient management decisions. Negative results must be combined with clinical observations, patient history, and epidemiological information. The expected result is Negative. Fact Sheet for Patients: SugarRoll.be Fact Sheet for Healthcare Providers: https://www.woods-mathews.com/ This test is not yet approved or cleared by the [place] FDA and  has been authorized for detection and/or diagnosis of SARS-CoV-2 by FDA under an Emergency Use Authorization (EUA). This EUA will remain  in effect (meaning this test can be used) for the duration of the COVID-19 declaration under Section 56 4(b)(1) of the Act, 21 U.S.C. section 360bbb-3(b)(1), unless the authorization is terminated or revoked sooner. Performed at [facility], [place] [address] [place], [place] [address]          Radiology Studies: Ct Head Wo Contrast  Result Date: 11/03/2019 CLINICAL DATA:  Altered  level of consciousness. Near syncope. EXAM: CT HEAD WITHOUT CONTRAST TECHNIQUE: Contiguous axial images were obtained from the base of the skull through the vertex without intravenous contrast. COMPARISON:  Head CT 11/19/2017, brain MRI 11/20/2017 FINDINGS: Brain: No intracranial hemorrhage, mass effect, or midline shift. Brain volume is normal for age. Unchanged chronic small vessel ischemia. No hydrocephalus. The basilar cisterns are patent. No evidence of territorial infarct or acute ischemia. No extra-axial or intracranial fluid collection. Vascular: Atherosclerosis of skullbase vasculature without hyperdense vessel or abnormal calcification. Skull: No fracture or focal lesion. Sinuses/Orbits: Paranasal sinuses and mastoid air cells are clear. The visualized orbits are unremarkable. Bilateral lens extraction. Other: None. IMPRESSION: 1. No acute intracranial abnormality. 2. Unchanged age related atrophy and chronic small vessel ischemia. Electronically Signed   By: Keith Rake M.D.   On: 11/03/2019 23:04   Mr Angio Head Wo Contrast  Result Date: 11/04/2019 CLINICAL DATA:  Hemiplegia. Left leg weakness. Dizziness and confusion. EXAM: MRI HEAD WITHOUT CONTRAST MRA HEAD WITHOUT CONTRAST MRA NECK WITHOUT CONTRAST TECHNIQUE: Multiplanar, multiecho pulse sequences of the brain and surrounding structures were obtained without intravenous contrast. Angiographic images of the Circle of Willis were obtained using MRA technique without intravenous contrast. Angiographic images of the neck were obtained using MRA technique without intravenous contrast. Carotid stenosis measurements (when applicable) are obtained utilizing NASCET criteria, using the distal internal carotid diameter as the denominator. COMPARISON:  Head CT yesterday. FINDINGS: MRI HEAD FINDINGS Brain: Diffusion imaging shows acute infarction in the right basal ganglia and radiating white matter tracts measuring approximately 2 cm in size. No  significant swelling. No hemorrhage. Elsewhere, there chronic small-vessel changes of the pons. No focal cerebellar insult. Cerebral hemispheres show chronic small-vessel ischemic changes of the deep and subcortical white matter. No large vessel territory infarction. No mass lesion, hemorrhage, hydrocephalus or extra-axial collection. Vascular: Major vessels at the base of the brain show flow. Skull and upper cervical spine: Negative Sinuses/Orbits: Clear/normal Other: None MRA HEAD FINDINGS Both internal carotid arteries are widely patent through the siphon regions. The anterior and middle cerebral vessels are patent without proximal stenosis, aneurysm or vascular malformation. More distal branch vessels do show atherosclerotic irregularity. Both vertebral arteries are patent to the basilar. No basilar stenosis. Posterior circulation branch vessels are patent. Distal vessel atherosclerotic irregularity, more pronounced in the left PCA than the right, including a 50% stenosis at the P2 level on the left. MRA NECK FINDINGS Both common carotid arteries show flow to the bifurcations. No carotid bifurcation stenosis. Cervical internal carotid arteries widely patent. Vertebral artery origins poorly seen without contrast. Normal appearance from  the lower neck to the foramen magnum. Vertebral arteries approximately equal in size with antegrade flow and no visible stenosis. IMPRESSION: Acute infarction and right basal ganglia and radiating white matter tracts measuring about 2 cm, consistent with lenticulostriate vessel infarction. No evidence of mass effect or hemorrhage. Chronic small-vessel ischemic changes elsewhere throughout the brain as outlined above. No large or medium vessel intracranial occlusion. Distal vessel atherosclerotic irregularity. Focal 50% stenosis of the left P2 segment of the PCA. No carotid bifurcation disease. Antegrade flow in both vertebral arteries without stenosis. Limited information available  in the chest using noncontrast technique. Electronically Signed   By: Nelson Chimes M.D.   On: 11/04/2019 13:01   Mr Angio Neck Wo Contrast  Result Date: 11/04/2019 CLINICAL DATA:  Hemiplegia. Left leg weakness. Dizziness and confusion. EXAM: MRI HEAD WITHOUT CONTRAST MRA HEAD WITHOUT CONTRAST MRA NECK WITHOUT CONTRAST TECHNIQUE: Multiplanar, multiecho pulse sequences of the brain and surrounding structures were obtained without intravenous contrast. Angiographic images of the Circle of Willis were obtained using MRA technique without intravenous contrast. Angiographic images of the neck were obtained using MRA technique without intravenous contrast. Carotid stenosis measurements (when applicable) are obtained utilizing NASCET criteria, using the distal internal carotid diameter as the denominator. COMPARISON:  Head CT yesterday. FINDINGS: MRI HEAD FINDINGS Brain: Diffusion imaging shows acute infarction in the right basal ganglia and radiating white matter tracts measuring approximately 2 cm in size. No significant swelling. No hemorrhage. Elsewhere, there chronic small-vessel changes of the pons. No focal cerebellar insult. Cerebral hemispheres show chronic small-vessel ischemic changes of the deep and subcortical white matter. No large vessel territory infarction. No mass lesion, hemorrhage, hydrocephalus or extra-axial collection. Vascular: Major vessels at the base of the brain show flow. Skull and upper cervical spine: Negative Sinuses/Orbits: Clear/normal Other: None MRA HEAD FINDINGS Both internal carotid arteries are widely patent through the siphon regions. The anterior and middle cerebral vessels are patent without proximal stenosis, aneurysm or vascular malformation. More distal branch vessels do show atherosclerotic irregularity. Both vertebral arteries are patent to the basilar. No basilar stenosis. Posterior circulation branch vessels are patent. Distal vessel atherosclerotic irregularity, more  pronounced in the left PCA than the right, including a 50% stenosis at the P2 level on the left. MRA NECK FINDINGS Both common carotid arteries show flow to the bifurcations. No carotid bifurcation stenosis. Cervical internal carotid arteries widely patent. Vertebral artery origins poorly seen without contrast. Normal appearance from the lower neck to the foramen magnum. Vertebral arteries approximately equal in size with antegrade flow and no visible stenosis. IMPRESSION: Acute infarction and right basal ganglia and radiating white matter tracts measuring about 2 cm, consistent with lenticulostriate vessel infarction. No evidence of mass effect or hemorrhage. Chronic small-vessel ischemic changes elsewhere throughout the brain as outlined above. No large or medium vessel intracranial occlusion. Distal vessel atherosclerotic irregularity. Focal 50% stenosis of the left P2 segment of the PCA. No carotid bifurcation disease. Antegrade flow in both vertebral arteries without stenosis. Limited information available in the chest using noncontrast technique. Electronically Signed   By: Nelson Chimes M.D.   On: 11/04/2019 13:01   Mr Brain Wo Contrast  Result Date: 11/04/2019 CLINICAL DATA:  Hemiplegia. Left leg weakness. Dizziness and confusion. EXAM: MRI HEAD WITHOUT CONTRAST MRA HEAD WITHOUT CONTRAST MRA NECK WITHOUT CONTRAST TECHNIQUE: Multiplanar, multiecho pulse sequences of the brain and surrounding structures were obtained without intravenous contrast. Angiographic images of the Circle of Willis were obtained using MRA technique without intravenous  contrast. Angiographic images of the neck were obtained using MRA technique without intravenous contrast. Carotid stenosis measurements (when applicable) are obtained utilizing NASCET criteria, using the distal internal carotid diameter as the denominator. COMPARISON:  Head CT yesterday. FINDINGS: MRI HEAD FINDINGS Brain: Diffusion imaging shows acute infarction in the  right basal ganglia and radiating white matter tracts measuring approximately 2 cm in size. No significant swelling. No hemorrhage. Elsewhere, there chronic small-vessel changes of the pons. No focal cerebellar insult. Cerebral hemispheres show chronic small-vessel ischemic changes of the deep and subcortical white matter. No large vessel territory infarction. No mass lesion, hemorrhage, hydrocephalus or extra-axial collection. Vascular: Major vessels at the base of the brain show flow. Skull and upper cervical spine: Negative Sinuses/Orbits: Clear/normal Other: None MRA HEAD FINDINGS Both internal carotid arteries are widely patent through the siphon regions. The anterior and middle cerebral vessels are patent without proximal stenosis, aneurysm or vascular malformation. More distal branch vessels do show atherosclerotic irregularity. Both vertebral arteries are patent to the basilar. No basilar stenosis. Posterior circulation branch vessels are patent. Distal vessel atherosclerotic irregularity, more pronounced in the left PCA than the right, including a 50% stenosis at the P2 level on the left. MRA NECK FINDINGS Both common carotid arteries show flow to the bifurcations. No carotid bifurcation stenosis. Cervical internal carotid arteries widely patent. Vertebral artery origins poorly seen without contrast. Normal appearance from the lower neck to the foramen magnum. Vertebral arteries approximately equal in size with antegrade flow and no visible stenosis. IMPRESSION: Acute infarction and right basal ganglia and radiating white matter tracts measuring about 2 cm, consistent with lenticulostriate vessel infarction. No evidence of mass effect or hemorrhage. Chronic small-vessel ischemic changes elsewhere throughout the brain as outlined above. No large or medium vessel intracranial occlusion. Distal vessel atherosclerotic irregularity. Focal 50% stenosis of the left P2 segment of the PCA. No carotid bifurcation  disease. Antegrade flow in both vertebral arteries without stenosis. Limited information available in the chest using noncontrast technique. Electronically Signed   By: Nelson Chimes M.D.   On: 11/04/2019 13:01   US Carotid Bilateral  Result Date: 11/04/2019 CLINICAL DATA:  TIA symptoms, hyperlipidemia recent syncope EXAM: BILATERAL CAROTID DUPLEX ULTRASOUND TECHNIQUE: Pearline Cables scale imaging, color Doppler and duplex ultrasound were performed of bilateral carotid and vertebral arteries in the neck. COMPARISON:  11/19/2017 FINDINGS: Criteria: Quantification of carotid stenosis is based on velocity parameters that correlate the residual internal carotid diameter with NASCET-based stenosis levels, using the diameter of the distal internal carotid lumen as the denominator for stenosis measurement. The following velocity measurements were obtained: RIGHT ICA: 102/21 cm/sec CCA: [id number] cm/sec SYSTOLIC ICA/CCA RATIO:  0.9 ECA: 154 cm/sec LEFT ICA: 157/14 cm/sec CCA: [id number] cm/sec SYSTOLIC ICA/CCA RATIO:  1.4 ECA: 149 cm/sec RIGHT CAROTID ARTERY: Minor echogenic shadowing plaque formation. No hemodynamically significant right ICA stenosis, velocity elevation, or turbulent flow. Degree of narrowing less than 50%. RIGHT VERTEBRAL ARTERY:  Antegrade LEFT CAROTID ARTERY: Similar scattered minor echogenic plaque formation. No hemodynamically significant left ICA stenosis, velocity elevation, or turbulent flow. LEFT VERTEBRAL ARTERY:  Antegrade IMPRESSION: Minor carotid atherosclerosis. No hemodynamically significant ICA stenosis. Degree of narrowing less than 50% bilaterally by ultrasound criteria. Patent antegrade vertebral flow bilaterally Electronically Signed   By: Jerilynn Mages.  Shick M.D.   On: 11/04/2019 09:02   Dg Chest Portable 1 View  Result Date: 11/03/2019 CLINICAL DATA:  85 year old female with near syncope. EXAM: PORTABLE CHEST 1 VIEW COMPARISON:  Chest radiograph dated 01/30/2019. FINDINGS: Mild  diffuse interstitial  prominence. No focal consolidation, pleural effusion, or pneumothorax. Stable cardiac silhouette. No acute osseous pathology. Osteopenia. IMPRESSION: 1. No acute cardiopulmonary process. 2. Stable mild interstitial prominence. Electronically Signed   By: Anner Crete M.D.   On: 11/03/2019 21:08        Scheduled Meds:  aspirin  300 mg Rectal Daily   Or   aspirin  325 mg Oral Daily   atorvastatin  40 mg Oral q1800   enoxaparin (LOVENOX) injection  40 mg Subcutaneous Q24H   pantoprazole  40 mg Oral Daily   sodium chloride flush  3 mL Intravenous Q12H   sodium chloride  1 g Oral TID   Travoprost (BAK Free)  1 drop Left Eye QHS   vitamin B-12  1,000 mcg Oral Daily   vitamin C  500 mg Oral Daily   Continuous Infusions:    LOS: 1 day    Time spent: 25 minutes    Edwin Dada, MD [facility] 11/05/2019, 1:16 PM     Please page though [place] or Epic secure chat:  For password, contact charge nurse

## 2019-11-06 DIAGNOSIS — I639 Cerebral infarction, unspecified: Secondary | ICD-10-CM | POA: Diagnosis present

## 2019-11-06 DIAGNOSIS — I160 Hypertensive urgency: Secondary | ICD-10-CM | POA: Diagnosis present

## 2019-11-06 DIAGNOSIS — R531 Weakness: Secondary | ICD-10-CM

## 2019-11-06 MED ORDER — ASPIRIN EC 325 MG PO TBEC: 325.0000 mg | DELAYED_RELEASE_TABLET | Freq: Every day | ORAL | 2 refills | Status: AC

## 2019-11-06 MED ORDER — AMLODIPINE BESYLATE 5 MG PO TABS: 5.0000 mg | ORAL_TABLET | Freq: Every day | ORAL | 0 refills | Status: AC

## 2019-11-06 MED ORDER — ATORVASTATIN CALCIUM 40 MG PO TABS: 40.0000 mg | ORAL_TABLET | Freq: Every day | ORAL | 0 refills | Status: DC

## 2019-11-06 NOTE — TOC Transition Note (Signed)
Transition of Care [facility]) - CM/SW Discharge Note   Patient Details  Name: Robin Fernandez MRN: [id number] Date of Birth: Jan 30, 1929  [facility] CM/SW Contact:  Weston Anna, LCSW Phone Number: 11/06/2019, 10:05 AM   Clinical Narrative:     Patient set to discharge home today- patient is set up with [facility] (confirmed with Corene Cornea that referral was received). Patient received 3n1 at bedside from [place].   All orders placed by MD and no other needs at this time.     Barriers to Discharge: Continued Medical Work up   Patient Goals and CMS Choice Patient states their goals for this hospitalization and ongoing recovery are:: to go home with home health CMS Medicare.gov Compare Post Acute Care list provided to:: Patient Choice offered to / list presented to : Patient  Discharge Placement                       Discharge Plan and Services   Discharge Planning Services: CM Consult Post Acute Care Choice: Home Health                    HH Arranged: PT, OT [facility] Agency: [place] ([facility]) Date [facility] Agency Contacted: 11/05/19 Time Center Line: [place] Representative spoke with at [place]: [place] ([facility]) Interventions     Readmission Risk Interventions No flowsheet data found.

## 2019-11-06 NOTE — Progress Notes (Signed)
Subjective: Significant improvement in the strength of the LLE since admission    Past Medical History:  Diagnosis Date  . GERD (gastroesophageal reflux disease)   . Glaucoma   . Vasovagal syncope     Past Surgical History:  Procedure Laterality Date  . ABDOMINAL HYSTERECTOMY    . EYE SURGERY      History reviewed. No pertinent family history.  Social History:  reports that she has quit smoking. Her smoking use included cigarettes. She has never used smokeless tobacco. She reports that she does not drink alcohol or use drugs.  Allergies  Allergen Reactions  . Salicylates Hives    Aspirin-like analgesic, salicylates group--urticaria  . Alendronate Other (See Comments)  . Amoxil [Amoxicillin] Other (See Comments)    Diarrhea, Blood in Stool  . Azopt [Brinzolamide] Other (See Comments)    Tingling in arms and feet  . Deltasone [Prednisone]     Affects blood pressure  . Erythromycin Other (See Comments)    Diarrhea, Blood in Stool   . Lumigan [Bimatoprost]     Nervousness  . Meloxicam Other (See Comments)  . Ofloxacin Other (See Comments)  . Parafon Forte Dsc [Chlorzoxazone] Other (See Comments)    "made very sick"  . Prevacid [Lansoprazole] Swelling    Physical Examination: Blood pressure (!) 161/79, pulse 69, temperature 99.5 F (37.5 C), temperature source Oral, resp. rate 20, height 4\' 8"  (1.422 m), weight 56.2 kg, SpO2 96 %.   Neurological Examination   Mental Status: Alert, oriented, thought content appropriate.  Speech fluent without evidence of aphasia.  Able to follow 3 step commands without difficulty. Cranial Nerves: II: Discs flat bilaterally; Visual fields grossly normal, pupils equal, round, reactive to light and accommodation III,IV, VI: ptosis not present, extra-ocular motions intact bilaterally V,VII: L facial droop VIII: hearing normal bilaterally IX,X: gag reflex present XI: bilateral shoulder shrug XII: midline tongue extension Motor: Right  : Upper extremity   5/5    Left:     Upper extremity   5/5  Lower extremity   5/5     Lower extremity   4/5 Tone and bulk:normal tone throughout; no atrophy noted Sensory: subjective numbness on L side  Deep Tendon Reflexes: 2+ and symmetric throughout Plantars: Right: downgoing   Left: downgoing Cerebellar: normal finger-to-nose Gait: not tested      Laboratory Studies:   Basic Metabolic Panel: Recent Labs  Lab 11/03/19 2035 11/04/19 0547 11/05/19 0338  NA 133* 132* 134*  K 3.3* 3.8 3.7  CL 101 99 102  CO2 26 26 24   GLUCOSE 171* 129* 96  BUN 11 7* 7*  CREATININE 0.70 0.42* 0.50  CALCIUM 8.7* 8.4* 8.7*  MG  --  2.0  --     Liver Function Tests: Recent Labs  Lab 11/03/19 2035  AST 29  ALT 14  ALKPHOS 94  BILITOT 0.5  PROT 6.6  ALBUMIN 3.6   No results for input(s): LIPASE, AMYLASE in the last 168 hours. No results for input(s): AMMONIA in the last 168 hours.  CBC: Recent Labs  Lab 11/03/19 2035 11/04/19 0547 11/05/19 0338  WBC 4.3 5.0 4.7  HGB 12.8 12.7 12.3  HCT 37.2 37.6 37.2  MCV 91.2 93.5 94.4  PLT 183 197 188    Cardiac Enzymes: No results for input(s): CKTOTAL, CKMB, CKMBINDEX, TROPONINI in the last 168 hours.  BNP: Invalid input(s): POCBNP  CBG: No results for input(s): GLUCAP in the last 168 hours.  Microbiology: Results for orders placed or  performed during the hospital encounter of 11/03/19  SARS CORONAVIRUS 2 (TAT 6-24 HRS) Nasopharyngeal Nasopharyngeal Swab     Status: None   Collection Time: 11/03/19 11:29 PM   Specimen: Nasopharyngeal Swab  Result Value Ref Range Status   SARS Coronavirus 2 NEGATIVE NEGATIVE Final    Comment: (NOTE) SARS-CoV-2 target nucleic acids are NOT DETECTED. The SARS-CoV-2 RNA is generally detectable in upper and lower respiratory specimens during the acute phase of infection. Negative results do not preclude SARS-CoV-2 infection, do not rule out co-infections with other pathogens, and should not be  used as the sole basis for treatment or other patient management decisions. Negative results must be combined with clinical observations, patient history, and epidemiological information. The expected result is Negative. Fact Sheet for Patients: SugarRoll.be Fact Sheet for Healthcare Providers: https://www.woods-mathews.com/ This test is not yet approved or cleared by the [place] FDA and  has been authorized for detection and/or diagnosis of SARS-CoV-2 by FDA under an Emergency Use Authorization (EUA). This EUA will remain  in effect (meaning this test can be used) for the duration of the COVID-19 declaration under Section 56 4(b)(1) of the Act, 21 U.S.C. section 360bbb-3(b)(1), unless the authorization is terminated or revoked sooner. Performed at [facility], [place] [address], [place], [place] [address]     Coagulation Studies: No results for input(s): LABPROT, INR in the last 72 hours.  Urinalysis:  Recent Labs  Lab 11/03/19 2035  COLORURINE STRAW*  LABSPEC 1.009  PHURINE 6.0  GLUCOSEU NEGATIVE  HGBUR NEGATIVE  BILIRUBINUR NEGATIVE  KETONESUR NEGATIVE  PROTEINUR NEGATIVE  NITRITE NEGATIVE  LEUKOCYTESUR NEGATIVE    Lipid Panel:     Component Value Date/Time   CHOL 162 11/04/2019 0547   TRIG 47 11/04/2019 0547   HDL 36 (L) 11/04/2019 0547   CHOLHDL 4.5 11/04/2019 0547   VLDL 9 11/04/2019 0547   LDLCALC 117 (H) 11/04/2019 0547    HgbA1C:  Lab Results  Component Value Date   HGBA1C 5.7 (H) 11/05/2019    Urine Drug Screen:  No results found for: LABOPIA, COCAINSCRNUR, LABBENZ, AMPHETMU, THCU, LABBARB  Alcohol Level: No results for input(s): ETH in the last 168 hours.  Other results: EKG: normal EKG, normal sinus rhythm, unchanged from previous tracings.  Imaging: Mr Angio Head Wo Contrast  Result Date: 11/04/2019 CLINICAL DATA:  Hemiplegia. Left leg weakness. Dizziness and confusion. EXAM: MRI HEAD  WITHOUT CONTRAST MRA HEAD WITHOUT CONTRAST MRA NECK WITHOUT CONTRAST TECHNIQUE: Multiplanar, multiecho pulse sequences of the brain and surrounding structures were obtained without intravenous contrast. Angiographic images of the Circle of Willis were obtained using MRA technique without intravenous contrast. Angiographic images of the neck were obtained using MRA technique without intravenous contrast. Carotid stenosis measurements (when applicable) are obtained utilizing NASCET criteria, using the distal internal carotid diameter as the denominator. COMPARISON:  Head CT yesterday. FINDINGS: MRI HEAD FINDINGS Brain: Diffusion imaging shows acute infarction in the right basal ganglia and radiating white matter tracts measuring approximately 2 cm in size. No significant swelling. No hemorrhage. Elsewhere, there chronic small-vessel changes of the pons. No focal cerebellar insult. Cerebral hemispheres show chronic small-vessel ischemic changes of the deep and subcortical white matter. No large vessel territory infarction. No mass lesion, hemorrhage, hydrocephalus or extra-axial collection. Vascular: Major vessels at the base of the brain show flow. Skull and upper cervical spine: Negative Sinuses/Orbits: Clear/normal Other: None MRA HEAD FINDINGS Both internal carotid arteries are widely patent through the siphon regions. The anterior  and middle cerebral vessels are patent without proximal stenosis, aneurysm or vascular malformation. More distal branch vessels do show atherosclerotic irregularity. Both vertebral arteries are patent to the basilar. No basilar stenosis. Posterior circulation branch vessels are patent. Distal vessel atherosclerotic irregularity, more pronounced in the left PCA than the right, including a 50% stenosis at the P2 level on the left. MRA NECK FINDINGS Both common carotid arteries show flow to the bifurcations. No carotid bifurcation stenosis. Cervical internal carotid arteries widely patent.  Vertebral artery origins poorly seen without contrast. Normal appearance from the lower neck to the foramen magnum. Vertebral arteries approximately equal in size with antegrade flow and no visible stenosis. IMPRESSION: Acute infarction and right basal ganglia and radiating white matter tracts measuring about 2 cm, consistent with lenticulostriate vessel infarction. No evidence of mass effect or hemorrhage. Chronic small-vessel ischemic changes elsewhere throughout the brain as outlined above. No large or medium vessel intracranial occlusion. Distal vessel atherosclerotic irregularity. Focal 50% stenosis of the left P2 segment of the PCA. No carotid bifurcation disease. Antegrade flow in both vertebral arteries without stenosis. Limited information available in the chest using noncontrast technique. Electronically Signed   By: Nelson Chimes M.D.   On: 11/04/2019 13:01   Mr Angio Neck Wo Contrast  Result Date: 11/04/2019 CLINICAL DATA:  Hemiplegia. Left leg weakness. Dizziness and confusion. EXAM: MRI HEAD WITHOUT CONTRAST MRA HEAD WITHOUT CONTRAST MRA NECK WITHOUT CONTRAST TECHNIQUE: Multiplanar, multiecho pulse sequences of the brain and surrounding structures were obtained without intravenous contrast. Angiographic images of the Circle of Willis were obtained using MRA technique without intravenous contrast. Angiographic images of the neck were obtained using MRA technique without intravenous contrast. Carotid stenosis measurements (when applicable) are obtained utilizing NASCET criteria, using the distal internal carotid diameter as the denominator. COMPARISON:  Head CT yesterday. FINDINGS: MRI HEAD FINDINGS Brain: Diffusion imaging shows acute infarction in the right basal ganglia and radiating white matter tracts measuring approximately 2 cm in size. No significant swelling. No hemorrhage. Elsewhere, there chronic small-vessel changes of the pons. No focal cerebellar insult. Cerebral hemispheres show chronic  small-vessel ischemic changes of the deep and subcortical white matter. No large vessel territory infarction. No mass lesion, hemorrhage, hydrocephalus or extra-axial collection. Vascular: Major vessels at the base of the brain show flow. Skull and upper cervical spine: Negative Sinuses/Orbits: Clear/normal Other: None MRA HEAD FINDINGS Both internal carotid arteries are widely patent through the siphon regions. The anterior and middle cerebral vessels are patent without proximal stenosis, aneurysm or vascular malformation. More distal branch vessels do show atherosclerotic irregularity. Both vertebral arteries are patent to the basilar. No basilar stenosis. Posterior circulation branch vessels are patent. Distal vessel atherosclerotic irregularity, more pronounced in the left PCA than the right, including a 50% stenosis at the P2 level on the left. MRA NECK FINDINGS Both common carotid arteries show flow to the bifurcations. No carotid bifurcation stenosis. Cervical internal carotid arteries widely patent. Vertebral artery origins poorly seen without contrast. Normal appearance from the lower neck to the foramen magnum. Vertebral arteries approximately equal in size with antegrade flow and no visible stenosis. IMPRESSION: Acute infarction and right basal ganglia and radiating white matter tracts measuring about 2 cm, consistent with lenticulostriate vessel infarction. No evidence of mass effect or hemorrhage. Chronic small-vessel ischemic changes elsewhere throughout the brain as outlined above. No large or medium vessel intracranial occlusion. Distal vessel atherosclerotic irregularity. Focal 50% stenosis of the left P2 segment of the PCA. No carotid bifurcation disease.  Antegrade flow in both vertebral arteries without stenosis. Limited information available in the chest using noncontrast technique. Electronically Signed   By: Nelson Chimes M.D.   On: 11/04/2019 13:01   Mr Brain Wo Contrast  Result Date:  11/04/2019 CLINICAL DATA:  Hemiplegia. Left leg weakness. Dizziness and confusion. EXAM: MRI HEAD WITHOUT CONTRAST MRA HEAD WITHOUT CONTRAST MRA NECK WITHOUT CONTRAST TECHNIQUE: Multiplanar, multiecho pulse sequences of the brain and surrounding structures were obtained without intravenous contrast. Angiographic images of the Circle of Willis were obtained using MRA technique without intravenous contrast. Angiographic images of the neck were obtained using MRA technique without intravenous contrast. Carotid stenosis measurements (when applicable) are obtained utilizing NASCET criteria, using the distal internal carotid diameter as the denominator. COMPARISON:  Head CT yesterday. FINDINGS: MRI HEAD FINDINGS Brain: Diffusion imaging shows acute infarction in the right basal ganglia and radiating white matter tracts measuring approximately 2 cm in size. No significant swelling. No hemorrhage. Elsewhere, there chronic small-vessel changes of the pons. No focal cerebellar insult. Cerebral hemispheres show chronic small-vessel ischemic changes of the deep and subcortical white matter. No large vessel territory infarction. No mass lesion, hemorrhage, hydrocephalus or extra-axial collection. Vascular: Major vessels at the base of the brain show flow. Skull and upper cervical spine: Negative Sinuses/Orbits: Clear/normal Other: None MRA HEAD FINDINGS Both internal carotid arteries are widely patent through the siphon regions. The anterior and middle cerebral vessels are patent without proximal stenosis, aneurysm or vascular malformation. More distal branch vessels do show atherosclerotic irregularity. Both vertebral arteries are patent to the basilar. No basilar stenosis. Posterior circulation branch vessels are patent. Distal vessel atherosclerotic irregularity, more pronounced in the left PCA than the right, including a 50% stenosis at the P2 level on the left. MRA NECK FINDINGS Both common carotid arteries show flow to the  bifurcations. No carotid bifurcation stenosis. Cervical internal carotid arteries widely patent. Vertebral artery origins poorly seen without contrast. Normal appearance from the lower neck to the foramen magnum. Vertebral arteries approximately equal in size with antegrade flow and no visible stenosis. IMPRESSION: Acute infarction and right basal ganglia and radiating white matter tracts measuring about 2 cm, consistent with lenticulostriate vessel infarction. No evidence of mass effect or hemorrhage. Chronic small-vessel ischemic changes elsewhere throughout the brain as outlined above. No large or medium vessel intracranial occlusion. Distal vessel atherosclerotic irregularity. Focal 50% stenosis of the left P2 segment of the PCA. No carotid bifurcation disease. Antegrade flow in both vertebral arteries without stenosis. Limited information available in the chest using noncontrast technique. Electronically Signed   By: Nelson Chimes M.D.   On: 11/04/2019 13:01     Assessment/Plan:  85 y.o. female with a known history of  glaucoma and GERD, who presented to the emergency room with acute onset of lightheadedness and dizziness associated with confusion. Upon presentation to the emergency room, blood pressure was 208/84. On examination she states she has subjective LUE and LLE weakness along with L facial droop. Mentation  Improved.  Pt was on ASA 81mg  daily prior to admission.  R BG lacunar infarct in setting of small vessel disease in setting of HTN. No significant intracranial stenosis.  - s/p discussion with family at bedside. Patient will have 24 hr supervision and is being d/c today  - BP control, goal [id number] systolic as has chronic HTN - ASA 325 daily as no significant intracranial stenosis. Lipitor - pt/ot appreciated - d/c planning today -11/06/2019, 11:11 AM

## 2019-11-06 NOTE — Discharge Instructions (Signed)

## 2019-11-06 NOTE — Progress Notes (Signed)
Physical Therapy Treatment Patient Details Name: Robin Fernandez MRN: [id number] DOB: 04/04/1929 Today's Date: 11/06/2019    History of Present Illness Pt is an 85 y.o. female with a known history of  glaucoma and GERD, who presented to the emergency room with acute onset of lightheadedness and dizziness associated with confusion. Upon presentation to the emergency room, blood pressure was 208/84. On examination she states she has subjective LUE and LLE weakness along with L facial droop. MD MRI impression includes acute infarction right basal ganglia and radiating white matter tracts.    PT Comments    Pt presented with deficits in strength, transfers, mobility, gait, balance, and activity tolerance but overall performed very well this session and made excellent progress towards goals.  Pt c/o feeling tired from not sleeping well but despite that was highly motivated to participate during the session.  Pt demonstrated significant improvements in balance and activity tolerance per below and reported no adverse symptoms other than fatigue.  Pt presented with improved gross and fine motor control of both the LUE and LLE this session. Pt will benefit from HHPT services upon discharge to safely address above deficits for decreased caregiver assistance and eventual return to PLOF.     Follow Up Recommendations  Home health PT;Supervision/Assistance - 24 hour     Equipment Recommendations  3in1 (PT);Rolling walker with 5" wheels    Recommendations for Other Services       Precautions / Restrictions Precautions Precautions: Fall Restrictions Weight Bearing Restrictions: No    Mobility  Bed Mobility               General bed mobility comments: NT, pt in recliner  Transfers Overall transfer level: Needs assistance Equipment used: Rolling walker (2 wheeled) Transfers: Sit to/from Stand Sit to Stand: Min guard         General transfer comment: No posterior instability this  session with pt able to stand from recliner without UE assist; sit to/from stand transfer training performed from various height surfaces  Ambulation/Gait Ambulation/Gait assistance: Min guard Gait Distance (Feet): 200 Feet x 1, 30 Feet x 1 Assistive device: Rolling walker (2 wheeled) Gait Pattern/deviations: Step-through pattern;Decreased step length - right;Decreased step length - left Gait velocity: decreased   General Gait Details: Slow cadence but steady without LOB this session with SpO2 and HR WNL after amb 200'   Stairs             Wheelchair Mobility    Modified Rankin (Stroke Patients Only)       Balance Overall balance assessment: Needs assistance Sitting-balance support: Feet supported Sitting balance-Leahy Scale: Good     Standing balance support: Bilateral upper extremity supported;During functional activity Standing balance-Leahy Scale: Good Standing balance comment: No LOB this session during transfers or amb and good stability during balance training without UE support                            Cognition Arousal/Alertness: Awake/alert Behavior During Therapy: WFL for tasks assessed/performed Overall Cognitive Status: Within Functional Limits for tasks assessed                                        Exercises Total Joint Exercises Ankle Circles/Pumps: Strengthening;Both;10 reps Quad Sets: Strengthening;Both;10 reps Gluteal Sets: Strengthening;Both;10 reps Towel Squeeze: Strengthening;Both;10 reps Long Arc Quad: Strengthening;Both;10 reps Knee Flexion:  Strengthening;Both;10 reps Marching in Standing: AROM;Both;10 reps;Standing Other Exercises: Car transfer training with verbal and visual demonstration with pt and dtr for sequencing using room chair to simulate car Other Exercises: Static and dynamic standing balance training without UE support with feet apart, together, and semi-tandem with reaching activities outside  BOS    General Comments        Pertinent Vitals/Pain Pain Assessment: No/denies pain    Home Living                      Prior Function            PT Goals (current goals can now be found in the care plan section) Progress towards PT goals: Progressing toward goals    Frequency    7X/week      PT Plan Current plan remains appropriate    Co-evaluation              AM-PAC PT "6 Clicks" Mobility   Outcome Measure  Help needed turning from your back to your side while in a flat bed without using bedrails?: A Little Help needed moving from lying on your back to sitting on the side of a flat bed without using bedrails?: A Little Help needed moving to and from a bed to a chair (including a wheelchair)?: A Little Help needed standing up from a chair using your arms (e.g., wheelchair or bedside chair)?: A Little Help needed to walk in hospital room?: A Little Help needed climbing 3-5 steps with a railing? : A Little 6 Click Score: 18    End of Session Equipment Utilized During Treatment: Gait belt Activity Tolerance: Patient tolerated treatment well Patient left: in chair;with call bell/phone within reach;with chair alarm set;with family/visitor present Nurse Communication: Mobility status PT Visit Diagnosis: Unsteadiness on feet (R26.81);Difficulty in walking, not elsewhere classified (R26.2);Muscle weakness (generalized) (M62.81);Hemiplegia and hemiparesis Hemiplegia - Right/Left: Left Hemiplegia - dominant/non-dominant: Non-dominant Hemiplegia - caused by: Cerebral infarction     Time: [id number] PT Time Calculation (min) (ACUTE ONLY): 41 min  Charges:  $Gait Training: 8-22 mins $Therapeutic Exercise: 23-37 mins                     D. Scott Kaamil [place] PT, DPT 11/06/19, 12:19 PM

## 2019-11-06 NOTE — Discharge Summary (Signed)
Physician Discharge Summary  Robin Fernandez [id number] DOB: 11/06/1929 DOA: 11/03/2019  PCP: Rusty Aus, MD  Admit date: 11/03/2019 Discharge date: 11/06/2019  Admitted From: Home Disposition: Home  Recommendations for Outpatient Follow-up:  1. Follow up with PCP in 1 week.  Outpatient referral to neurology (stroke service in [place]) made.  Home Health: PT and OT Equipment/Devices: Rolling walker , 3 and 1  (has walker and wheelchair for as needed use at home) Discharge Condition: Fair CODE STATUS: Full code Diet recommendation: Heart Healthy     Discharge Diagnoses:  Principal Problem:   Acute ischemic stroke [facility])   Active Problems: Acute toxic metabolic encephalopathy.   Hypertensive urgency Ventricular tachycardia  Brief narrative/HPI Please refer to admission H&P for details, in brief, 85 year old female with history of glaucoma,?  Chronic hyponatremia presented with acute dizziness and near syncopal episode.  In the ED she reported having left-sided heaviness and motor weakness.  Her blood pressure was elevated to 208/84 mmHg.  Initial labs were unremarkable while head CT was negative.  MRI of the brain showed 2 cm right basal ganglia infarct.  MRA of the head and neck was unremarkable.   Hospital course Acute right basal ganglia ischemic stroke MRI and MRA findings as above.  Suspect small vessel stroke secondary to uncontrolled hypertension.  2D echo with normal EF and no wall motion abnormality.  Carotid ultrasound with <70% stenosis.  LDL of 117.  Was on simvastatin which was switched to Lipitor 40 mg daily.  Patient on baby aspirin at home and switch to full dose aspirin (325 mg daily). Allowed permissive hypertension while in the hospital and prescribed low-dose amlodipine (5 mg daily) to start after 5 days. Patient has no further residual weakness and blood pressure stable. Provided education on secondary stroke prevention.  Outpatient referral to  stroke team made.  Neurology consult appreciated  Seen by PT and OT and recommends home health.  Active symptoms Hypertensive urgency Not on antihypertensive previously.  Systolic blood pressure in the 150s-170s.  Allowed permissive hypertension and prescribe low-dose amlodipine upon discharge to be started after 4-5 days.  Chronic hyponatremia Asymptomatic.  Reports taking salt tablets.  Glaucoma Continue home meds  Ventricular tachycardia Noted short runs on 12/7.  Asymptomatic.  Potassium and magnesium were normal.  No further episodes.  Patient stable to be discharged home with outpatient follow-up.  Family communication: None at bedside (daughter involved in care) Procedure: CT head, MRI brain, MRA head and neck, 2D echo, carotid Doppler Disposition: Home   Discharge Instructions  Discharge Instructions    Ambulatory referral to Neurology   Complete by: As directed    An appointment is requested in approximately: 6 weeks     Allergies as of 11/06/2019      Reactions   Salicylates Hives   Aspirin-like analgesic, salicylates group--urticaria   Alendronate Other (See Comments)   Amoxil [amoxicillin] Other (See Comments)   Diarrhea, Blood in Stool   Azopt [brinzolamide] Other (See Comments)   Tingling in arms and feet   Deltasone [prednisone]    Affects blood pressure   Erythromycin Other (See Comments)   Diarrhea, Blood in Stool   Lumigan [bimatoprost]    Nervousness   Meloxicam Other (See Comments)   Ofloxacin Other (See Comments)   Parafon Forte Dsc [chlorzoxazone] Other (See Comments)   "made very sick"   Prevacid [lansoprazole] Swelling      Medication List    TAKE these medications   amLODipine 5 MG  tablet Commonly known as: NORVASC Take 1 tablet (5 mg total) by mouth daily. Start taking on: November 10, 2019   aspirin EC 325 MG tablet Take 1 tablet (325 mg total) by mouth daily. What changed:   medication strength  how much to take    atorvastatin 40 MG tablet Commonly known as: LIPITOR Take 1 tablet (40 mg total) by mouth daily at 6 PM.   azelastine 0.1 % nasal spray Commonly known as: ASTELIN Place 2 sprays into both nostrils 2 (two) times daily.   Cranberry 400 MG Caps Take 800 mg by mouth 2 (two) times daily.   diclofenac sodium 1 % Gel Commonly known as: VOLTAREN Apply 4 g topically 4 (four) times daily.   fluticasone 50 MCG/ACT nasal spray Commonly known as: FLONASE Place 2 sprays into both nostrils daily.   omeprazole 20 MG capsule Commonly known as: PRILOSEC Take 1 capsule by mouth daily.   sodium chloride 1 g tablet Take 1 g by mouth 3 (three) times daily.   Travoprost (BAK Free) 0.004 % Soln ophthalmic solution Commonly known as: TRAVATAN Place 1 drop into the left eye at bedtime.   vitamin B-12 500 MCG tablet Commonly known as: CYANOCOBALAMIN Take 1,000 mcg by mouth daily.   vitamin C 500 MG tablet Commonly known as: ASCORBIC ACID Take 500 mg by mouth daily.      Follow-up Information    Rusty Aus, MD Follow up in 1 week(s).   Specialty: Internal Medicine Contact information: [place] [address] [phone]          Allergies  Allergen Reactions  . Salicylates Hives    Aspirin-like analgesic, salicylates group--urticaria  . Alendronate Other (See Comments)  . Amoxil [Amoxicillin] Other (See Comments)    Diarrhea, Blood in Stool  . Azopt [Brinzolamide] Other (See Comments)    Tingling in arms and feet  . Deltasone [Prednisone]     Affects blood pressure  . Erythromycin Other (See Comments)    Diarrhea, Blood in Stool   . Lumigan [Bimatoprost]     Nervousness  . Meloxicam Other (See Comments)  . Ofloxacin Other (See Comments)  . Parafon Forte Dsc [Chlorzoxazone] Other (See Comments)    "made very sick"  . Prevacid [Lansoprazole] Swelling     Procedures/Studies: Ct Head Wo Contrast  Result Date:  11/03/2019 CLINICAL DATA:  Altered level of consciousness. Near syncope. EXAM: CT HEAD WITHOUT CONTRAST TECHNIQUE: Contiguous axial images were obtained from the base of the skull through the vertex without intravenous contrast. COMPARISON:  Head CT 11/19/2017, brain MRI 11/20/2017 FINDINGS: Brain: No intracranial hemorrhage, mass effect, or midline shift. Brain volume is normal for age. Unchanged chronic small vessel ischemia. No hydrocephalus. The basilar cisterns are patent. No evidence of territorial infarct or acute ischemia. No extra-axial or intracranial fluid collection. Vascular: Atherosclerosis of skullbase vasculature without hyperdense vessel or abnormal calcification. Skull: No fracture or focal lesion. Sinuses/Orbits: Paranasal sinuses and mastoid air cells are clear. The visualized orbits are unremarkable. Bilateral lens extraction. Other: None. IMPRESSION: 1. No acute intracranial abnormality. 2. Unchanged age related atrophy and chronic small vessel ischemia. Electronically Signed   By: Keith Rake M.D.   On: 11/03/2019 23:04   Mr Angio Head Wo Contrast  Result Date: 11/04/2019 CLINICAL DATA:  Hemiplegia. Left leg weakness. Dizziness and confusion. EXAM: MRI HEAD WITHOUT CONTRAST MRA HEAD WITHOUT CONTRAST MRA NECK WITHOUT CONTRAST TECHNIQUE: Multiplanar, multiecho pulse sequences of the brain  and surrounding structures were obtained without intravenous contrast. Angiographic images of the Circle of Willis were obtained using MRA technique without intravenous contrast. Angiographic images of the neck were obtained using MRA technique without intravenous contrast. Carotid stenosis measurements (when applicable) are obtained utilizing NASCET criteria, using the distal internal carotid diameter as the denominator. COMPARISON:  Head CT yesterday. FINDINGS: MRI HEAD FINDINGS Brain: Diffusion imaging shows acute infarction in the right basal ganglia and radiating white matter tracts measuring  approximately 2 cm in size. No significant swelling. No hemorrhage. Elsewhere, there chronic small-vessel changes of the pons. No focal cerebellar insult. Cerebral hemispheres show chronic small-vessel ischemic changes of the deep and subcortical white matter. No large vessel territory infarction. No mass lesion, hemorrhage, hydrocephalus or extra-axial collection. Vascular: Major vessels at the base of the brain show flow. Skull and upper cervical spine: Negative Sinuses/Orbits: Clear/normal Other: None MRA HEAD FINDINGS Both internal carotid arteries are widely patent through the siphon regions. The anterior and middle cerebral vessels are patent without proximal stenosis, aneurysm or vascular malformation. More distal branch vessels do show atherosclerotic irregularity. Both vertebral arteries are patent to the basilar. No basilar stenosis. Posterior circulation branch vessels are patent. Distal vessel atherosclerotic irregularity, more pronounced in the left PCA than the right, including a 50% stenosis at the P2 level on the left. MRA NECK FINDINGS Both common carotid arteries show flow to the bifurcations. No carotid bifurcation stenosis. Cervical internal carotid arteries widely patent. Vertebral artery origins poorly seen without contrast. Normal appearance from the lower neck to the foramen magnum. Vertebral arteries approximately equal in size with antegrade flow and no visible stenosis. IMPRESSION: Acute infarction and right basal ganglia and radiating white matter tracts measuring about 2 cm, consistent with lenticulostriate vessel infarction. No evidence of mass effect or hemorrhage. Chronic small-vessel ischemic changes elsewhere throughout the brain as outlined above. No large or medium vessel intracranial occlusion. Distal vessel atherosclerotic irregularity. Focal 50% stenosis of the left P2 segment of the PCA. No carotid bifurcation disease. Antegrade flow in both vertebral arteries without  stenosis. Limited information available in the chest using noncontrast technique. Electronically Signed   By: Nelson Chimes M.D.   On: 11/04/2019 13:01   Mr Angio Neck Wo Contrast  Result Date: 11/04/2019 CLINICAL DATA:  Hemiplegia. Left leg weakness. Dizziness and confusion. EXAM: MRI HEAD WITHOUT CONTRAST MRA HEAD WITHOUT CONTRAST MRA NECK WITHOUT CONTRAST TECHNIQUE: Multiplanar, multiecho pulse sequences of the brain and surrounding structures were obtained without intravenous contrast. Angiographic images of the Circle of Willis were obtained using MRA technique without intravenous contrast. Angiographic images of the neck were obtained using MRA technique without intravenous contrast. Carotid stenosis measurements (when applicable) are obtained utilizing NASCET criteria, using the distal internal carotid diameter as the denominator. COMPARISON:  Head CT yesterday. FINDINGS: MRI HEAD FINDINGS Brain: Diffusion imaging shows acute infarction in the right basal ganglia and radiating white matter tracts measuring approximately 2 cm in size. No significant swelling. No hemorrhage. Elsewhere, there chronic small-vessel changes of the pons. No focal cerebellar insult. Cerebral hemispheres show chronic small-vessel ischemic changes of the deep and subcortical white matter. No large vessel territory infarction. No mass lesion, hemorrhage, hydrocephalus or extra-axial collection. Vascular: Major vessels at the base of the brain show flow. Skull and upper cervical spine: Negative Sinuses/Orbits: Clear/normal Other: None MRA HEAD FINDINGS Both internal carotid arteries are widely patent through the siphon regions. The anterior and middle cerebral vessels are patent without proximal stenosis, aneurysm or vascular  malformation. More distal branch vessels do show atherosclerotic irregularity. Both vertebral arteries are patent to the basilar. No basilar stenosis. Posterior circulation branch vessels are patent. Distal  vessel atherosclerotic irregularity, more pronounced in the left PCA than the right, including a 50% stenosis at the P2 level on the left. MRA NECK FINDINGS Both common carotid arteries show flow to the bifurcations. No carotid bifurcation stenosis. Cervical internal carotid arteries widely patent. Vertebral artery origins poorly seen without contrast. Normal appearance from the lower neck to the foramen magnum. Vertebral arteries approximately equal in size with antegrade flow and no visible stenosis. IMPRESSION: Acute infarction and right basal ganglia and radiating white matter tracts measuring about 2 cm, consistent with lenticulostriate vessel infarction. No evidence of mass effect or hemorrhage. Chronic small-vessel ischemic changes elsewhere throughout the brain as outlined above. No large or medium vessel intracranial occlusion. Distal vessel atherosclerotic irregularity. Focal 50% stenosis of the left P2 segment of the PCA. No carotid bifurcation disease. Antegrade flow in both vertebral arteries without stenosis. Limited information available in the chest using noncontrast technique. Electronically Signed   By: Nelson Chimes M.D.   On: 11/04/2019 13:01   Mr Brain Wo Contrast  Result Date: 11/04/2019 CLINICAL DATA:  Hemiplegia. Left leg weakness. Dizziness and confusion. EXAM: MRI HEAD WITHOUT CONTRAST MRA HEAD WITHOUT CONTRAST MRA NECK WITHOUT CONTRAST TECHNIQUE: Multiplanar, multiecho pulse sequences of the brain and surrounding structures were obtained without intravenous contrast. Angiographic images of the Circle of Willis were obtained using MRA technique without intravenous contrast. Angiographic images of the neck were obtained using MRA technique without intravenous contrast. Carotid stenosis measurements (when applicable) are obtained utilizing NASCET criteria, using the distal internal carotid diameter as the denominator. COMPARISON:  Head CT yesterday. FINDINGS: MRI HEAD FINDINGS Brain:  Diffusion imaging shows acute infarction in the right basal ganglia and radiating white matter tracts measuring approximately 2 cm in size. No significant swelling. No hemorrhage. Elsewhere, there chronic small-vessel changes of the pons. No focal cerebellar insult. Cerebral hemispheres show chronic small-vessel ischemic changes of the deep and subcortical white matter. No large vessel territory infarction. No mass lesion, hemorrhage, hydrocephalus or extra-axial collection. Vascular: Major vessels at the base of the brain show flow. Skull and upper cervical spine: Negative Sinuses/Orbits: Clear/normal Other: None MRA HEAD FINDINGS Both internal carotid arteries are widely patent through the siphon regions. The anterior and middle cerebral vessels are patent without proximal stenosis, aneurysm or vascular malformation. More distal branch vessels do show atherosclerotic irregularity. Both vertebral arteries are patent to the basilar. No basilar stenosis. Posterior circulation branch vessels are patent. Distal vessel atherosclerotic irregularity, more pronounced in the left PCA than the right, including a 50% stenosis at the P2 level on the left. MRA NECK FINDINGS Both common carotid arteries show flow to the bifurcations. No carotid bifurcation stenosis. Cervical internal carotid arteries widely patent. Vertebral artery origins poorly seen without contrast. Normal appearance from the lower neck to the foramen magnum. Vertebral arteries approximately equal in size with antegrade flow and no visible stenosis. IMPRESSION: Acute infarction and right basal ganglia and radiating white matter tracts measuring about 2 cm, consistent with lenticulostriate vessel infarction. No evidence of mass effect or hemorrhage. Chronic small-vessel ischemic changes elsewhere throughout the brain as outlined above. No large or medium vessel intracranial occlusion. Distal vessel atherosclerotic irregularity. Focal 50% stenosis of the left  P2 segment of the PCA. No carotid bifurcation disease. Antegrade flow in both vertebral arteries without stenosis. Limited information available in  the chest using noncontrast technique. Electronically Signed   By: Nelson Chimes M.D.   On: 11/04/2019 13:01   US Carotid Bilateral  Result Date: 11/04/2019 CLINICAL DATA:  TIA symptoms, hyperlipidemia recent syncope EXAM: BILATERAL CAROTID DUPLEX ULTRASOUND TECHNIQUE: Pearline Cables scale imaging, color Doppler and duplex ultrasound were performed of bilateral carotid and vertebral arteries in the neck. COMPARISON:  11/19/2017 FINDINGS: Criteria: Quantification of carotid stenosis is based on velocity parameters that correlate the residual internal carotid diameter with NASCET-based stenosis levels, using the diameter of the distal internal carotid lumen as the denominator for stenosis measurement. The following velocity measurements were obtained: RIGHT ICA: 102/21 cm/sec CCA: [id number] cm/sec SYSTOLIC ICA/CCA RATIO:  0.9 ECA: 154 cm/sec LEFT ICA: 157/14 cm/sec CCA: [id number] cm/sec SYSTOLIC ICA/CCA RATIO:  1.4 ECA: 149 cm/sec RIGHT CAROTID ARTERY: Minor echogenic shadowing plaque formation. No hemodynamically significant right ICA stenosis, velocity elevation, or turbulent flow. Degree of narrowing less than 50%. RIGHT VERTEBRAL ARTERY:  Antegrade LEFT CAROTID ARTERY: Similar scattered minor echogenic plaque formation. No hemodynamically significant left ICA stenosis, velocity elevation, or turbulent flow. LEFT VERTEBRAL ARTERY:  Antegrade IMPRESSION: Minor carotid atherosclerosis. No hemodynamically significant ICA stenosis. Degree of narrowing less than 50% bilaterally by ultrasound criteria. Patent antegrade vertebral flow bilaterally Electronically Signed   By: Jerilynn Mages.  Shick M.D.   On: 11/04/2019 09:02   Dg Chest Portable 1 View  Result Date: 11/03/2019 CLINICAL DATA:  85 year old female with near syncope. EXAM: PORTABLE CHEST 1 VIEW COMPARISON:  Chest radiograph dated  01/30/2019. FINDINGS: Mild diffuse interstitial prominence. No focal consolidation, pleural effusion, or pneumothorax. Stable cardiac silhouette. No acute osseous pathology. Osteopenia. IMPRESSION: 1. No acute cardiopulmonary process. 2. Stable mild interstitial prominence. Electronically Signed   By: Anner Crete M.D.   On: 11/03/2019 21:08      Subjective: Denies any symptoms except for mild left lower extremity weakness which has improved since yesterday.  Discharge Exam: Vitals:   11/06/19 0444 11/06/19 0943  BP: (!) 165/64 (!) 161/79  Pulse: 76 69  Resp: 17 20  Temp: 99 F (37.2 C) 99.5 F (37.5 C)  SpO2: 92% 96%   Vitals:   11/05/19 1610 11/05/19 1924 11/06/19 0444 11/06/19 0943  BP: (!) 179/73 (!) 169/77 (!) 165/64 (!) 161/79  Pulse: 76 74 76 69  Resp: 16 18 17 20   Temp: 98.3 F (36.8 C) 98.1 F (36.7 C) 99 F (37.2 C) 99.5 F (37.5 C)  TempSrc: Oral Oral Oral Oral  SpO2: 96% 95% 92% 96%  Weight:      Height:        General: Elderly female not in distress HEENT: Moist mucosa, supple neck Chest: Clear bilaterally CVs: Normal S1-S2, no murmurs GI: Soft, nondistended, nontender Musculoskeletal: Warm, no edema CNS: Alert and oriented, 4+/5 power in left lower extremity, normal sensation    The results of significant diagnostics from this hospitalization (including imaging, microbiology, ancillary and laboratory) are listed below for reference.     Microbiology: Recent Results (from the past 240 hour(s))  SARS CORONAVIRUS 2 (TAT 6-24 HRS) Nasopharyngeal Nasopharyngeal Swab     Status: None   Collection Time: 11/03/19 11:29 PM   Specimen: Nasopharyngeal Swab  Result Value Ref Range Status   SARS Coronavirus 2 NEGATIVE NEGATIVE Final    Comment: (NOTE) SARS-CoV-2 target nucleic acids are NOT DETECTED. The SARS-CoV-2 RNA is generally detectable in upper and lower respiratory specimens during the acute phase of infection. Negative results do not preclude  SARS-CoV-2 infection, do not  rule out co-infections with other pathogens, and should not be used as the sole basis for treatment or other patient management decisions. Negative results must be combined with clinical observations, patient history, and epidemiological information. The expected result is Negative. Fact Sheet for Patients: SugarRoll.be Fact Sheet for Healthcare Providers: https://www.woods-mathews.com/ This test is not yet approved or cleared by the [place] FDA and  has been authorized for detection and/or diagnosis of SARS-CoV-2 by FDA under an Emergency Use Authorization (EUA). This EUA will remain  in effect (meaning this test can be used) for the duration of the COVID-19 declaration under Section 56 4(b)(1) of the Act, 21 U.S.C. section 360bbb-3(b)(1), unless the authorization is terminated or revoked sooner. Performed at [facility], [place] [address] [place], [place] [address]      Labs: BNP (last 3 results) No results for input(s): BNP in the last 8760 hours. Basic Metabolic Panel: Recent Labs  Lab 11/03/19 2035 11/04/19 0547 11/05/19 0338  NA 133* 132* 134*  K 3.3* 3.8 3.7  CL 101 99 102  CO2 26 26 24   GLUCOSE 171* 129* 96  BUN 11 7* 7*  CREATININE 0.70 0.42* 0.50  CALCIUM 8.7* 8.4* 8.7*  MG  --  2.0  --    Liver Function Tests: Recent Labs  Lab 11/03/19 2035  AST 29  ALT 14  ALKPHOS 94  BILITOT 0.5  PROT 6.6  ALBUMIN 3.6   No results for input(s): LIPASE, AMYLASE in the last 168 hours. No results for input(s): AMMONIA in the last 168 hours. CBC: Recent Labs  Lab 11/03/19 2035 11/04/19 0547 11/05/19 0338  WBC 4.3 5.0 4.7  HGB 12.8 12.7 12.3  HCT 37.2 37.6 37.2  MCV 91.2 93.5 94.4  PLT 183 197 188   Cardiac Enzymes: No results for input(s): CKTOTAL, CKMB, CKMBINDEX, TROPONINI in the last 168 hours. BNP: Invalid input(s): POCBNP CBG: No results for input(s): GLUCAP in the last  168 hours. D-Dimer No results for input(s): DDIMER in the last 72 hours. Hgb A1c Recent Labs    11/05/19 0338  HGBA1C 5.7*   Lipid Profile Recent Labs    11/04/19 0547  CHOL 162  HDL 36*  LDLCALC 117*  TRIG 47  CHOLHDL 4.5   Thyroid function studies No results for input(s): TSH, T4TOTAL, T3FREE, THYROIDAB in the last 72 hours.  Invalid input(s): FREET3 Anemia work up No results for input(s): VITAMINB12, FOLATE, FERRITIN, TIBC, IRON, RETICCTPCT in the last 72 hours. Urinalysis    Component Value Date/Time   COLORURINE STRAW (A) 11/03/2019 2035   APPEARANCEUR CLEAR (A) 11/03/2019 2035   APPEARANCEUR Hazy 07/06/2014 1426   LABSPEC 1.009 11/03/2019 2035   LABSPEC 1.017 07/06/2014 1426   PHURINE 6.0 11/03/2019 2035   GLUCOSEU NEGATIVE 11/03/2019 2035   GLUCOSEU Negative 07/06/2014 1426   HGBUR NEGATIVE 11/03/2019 2035   BILIRUBINUR NEGATIVE 11/03/2019 2035   BILIRUBINUR Negative 07/06/2014 1426   KETONESUR NEGATIVE 11/03/2019 2035   PROTEINUR NEGATIVE 11/03/2019 2035   NITRITE NEGATIVE 11/03/2019 2035   LEUKOCYTESUR NEGATIVE 11/03/2019 2035   LEUKOCYTESUR 3+ 07/06/2014 1426   Sepsis Labs Invalid input(s): PROCALCITONIN,  WBC,  LACTICIDVEN Microbiology Recent Results (from the past 240 hour(s))  SARS CORONAVIRUS 2 (TAT 6-24 HRS) Nasopharyngeal Nasopharyngeal Swab     Status: None   Collection Time: 11/03/19 11:29 PM   Specimen: Nasopharyngeal Swab  Result Value Ref Range Status   SARS Coronavirus 2 NEGATIVE NEGATIVE Final    Comment: (NOTE) SARS-CoV-2 target  nucleic acids are NOT DETECTED. The SARS-CoV-2 RNA is generally detectable in upper and lower respiratory specimens during the acute phase of infection. Negative results do not preclude SARS-CoV-2 infection, do not rule out co-infections with other pathogens, and should not be used as the sole basis for treatment or other patient management decisions. Negative results must be combined with clinical  observations, patient history, and epidemiological information. The expected result is Negative. Fact Sheet for Patients: SugarRoll.be Fact Sheet for Healthcare Providers: https://www.woods-mathews.com/ This test is not yet approved or cleared by the [place] FDA and  has been authorized for detection and/or diagnosis of SARS-CoV-2 by FDA under an Emergency Use Authorization (EUA). This EUA will remain  in effect (meaning this test can be used) for the duration of the COVID-19 declaration under Section 56 4(b)(1) of the Act, 21 U.S.C. section 360bbb-3(b)(1), unless the authorization is terminated or revoked sooner. Performed at [facility], [place] [address] [place], [place] [address]      Time coordinating discharge: 35 minutes  SIGNED:   Louellen Molder, MD  [facility] 11/06/2019, 9:46 AM Pager   If 7PM-7AM, please contact night-coverage [email or web address] Password [email or web address]

## 2019-11-08 DIAGNOSIS — I639 Cerebral infarction, unspecified: Secondary | ICD-10-CM | POA: Diagnosis not present

## 2019-11-08 DIAGNOSIS — Z87891 Personal history of nicotine dependence: Secondary | ICD-10-CM | POA: Diagnosis not present

## 2019-11-08 DIAGNOSIS — E785 Hyperlipidemia, unspecified: Secondary | ICD-10-CM | POA: Diagnosis not present

## 2019-11-08 DIAGNOSIS — Z7982 Long term (current) use of aspirin: Secondary | ICD-10-CM | POA: Diagnosis not present

## 2019-11-08 DIAGNOSIS — R42 Dizziness and giddiness: Secondary | ICD-10-CM | POA: Diagnosis not present

## 2019-11-08 DIAGNOSIS — H409 Unspecified glaucoma: Secondary | ICD-10-CM | POA: Diagnosis not present

## 2019-11-08 DIAGNOSIS — I69344 Monoplegia of lower limb following cerebral infarction affecting left non-dominant side: Secondary | ICD-10-CM | POA: Diagnosis not present

## 2019-11-08 DIAGNOSIS — I10 Essential (primary) hypertension: Secondary | ICD-10-CM | POA: Diagnosis not present

## 2019-11-08 DIAGNOSIS — E871 Hypo-osmolality and hyponatremia: Secondary | ICD-10-CM | POA: Diagnosis not present

## 2019-11-08 DIAGNOSIS — Z9181 History of falling: Secondary | ICD-10-CM | POA: Diagnosis not present

## 2019-11-08 DIAGNOSIS — K219 Gastro-esophageal reflux disease without esophagitis: Secondary | ICD-10-CM | POA: Diagnosis not present

## 2019-11-13 DIAGNOSIS — E039 Hypothyroidism, unspecified: Secondary | ICD-10-CM | POA: Diagnosis not present

## 2019-11-13 DIAGNOSIS — E871 Hypo-osmolality and hyponatremia: Secondary | ICD-10-CM | POA: Diagnosis not present

## 2019-11-13 DIAGNOSIS — I639 Cerebral infarction, unspecified: Secondary | ICD-10-CM | POA: Diagnosis not present

## 2019-11-13 DIAGNOSIS — E538 Deficiency of other specified B group vitamins: Secondary | ICD-10-CM | POA: Diagnosis not present

## 2019-11-19 DIAGNOSIS — Z87891 Personal history of nicotine dependence: Secondary | ICD-10-CM | POA: Diagnosis not present

## 2019-11-19 DIAGNOSIS — H409 Unspecified glaucoma: Secondary | ICD-10-CM | POA: Diagnosis not present

## 2019-11-19 DIAGNOSIS — E871 Hypo-osmolality and hyponatremia: Secondary | ICD-10-CM | POA: Diagnosis not present

## 2019-11-19 DIAGNOSIS — I69344 Monoplegia of lower limb following cerebral infarction affecting left non-dominant side: Secondary | ICD-10-CM | POA: Diagnosis not present

## 2019-11-19 DIAGNOSIS — Z9181 History of falling: Secondary | ICD-10-CM | POA: Diagnosis not present

## 2019-11-19 DIAGNOSIS — E785 Hyperlipidemia, unspecified: Secondary | ICD-10-CM | POA: Diagnosis not present

## 2019-11-19 DIAGNOSIS — K219 Gastro-esophageal reflux disease without esophagitis: Secondary | ICD-10-CM | POA: Diagnosis not present

## 2019-11-19 DIAGNOSIS — R42 Dizziness and giddiness: Secondary | ICD-10-CM | POA: Diagnosis not present

## 2019-11-19 DIAGNOSIS — I10 Essential (primary) hypertension: Secondary | ICD-10-CM | POA: Diagnosis not present

## 2019-11-19 DIAGNOSIS — Z7982 Long term (current) use of aspirin: Secondary | ICD-10-CM | POA: Diagnosis not present

## 2019-11-29 DIAGNOSIS — I69344 Monoplegia of lower limb following cerebral infarction affecting left non-dominant side: Secondary | ICD-10-CM | POA: Diagnosis not present

## 2019-11-29 DIAGNOSIS — K219 Gastro-esophageal reflux disease without esophagitis: Secondary | ICD-10-CM | POA: Diagnosis not present

## 2019-11-29 DIAGNOSIS — E871 Hypo-osmolality and hyponatremia: Secondary | ICD-10-CM | POA: Diagnosis not present

## 2019-11-29 DIAGNOSIS — Z7982 Long term (current) use of aspirin: Secondary | ICD-10-CM | POA: Diagnosis not present

## 2019-11-29 DIAGNOSIS — E785 Hyperlipidemia, unspecified: Secondary | ICD-10-CM | POA: Diagnosis not present

## 2019-11-29 DIAGNOSIS — I10 Essential (primary) hypertension: Secondary | ICD-10-CM | POA: Diagnosis not present

## 2019-11-29 DIAGNOSIS — Z9181 History of falling: Secondary | ICD-10-CM | POA: Diagnosis not present

## 2019-11-29 DIAGNOSIS — H409 Unspecified glaucoma: Secondary | ICD-10-CM | POA: Diagnosis not present

## 2019-11-29 DIAGNOSIS — R42 Dizziness and giddiness: Secondary | ICD-10-CM | POA: Diagnosis not present

## 2019-11-29 DIAGNOSIS — Z87891 Personal history of nicotine dependence: Secondary | ICD-10-CM | POA: Diagnosis not present

## 2019-12-06 DIAGNOSIS — Z9181 History of falling: Secondary | ICD-10-CM | POA: Diagnosis not present

## 2019-12-06 DIAGNOSIS — E785 Hyperlipidemia, unspecified: Secondary | ICD-10-CM | POA: Diagnosis not present

## 2019-12-06 DIAGNOSIS — R42 Dizziness and giddiness: Secondary | ICD-10-CM | POA: Diagnosis not present

## 2019-12-06 DIAGNOSIS — K219 Gastro-esophageal reflux disease without esophagitis: Secondary | ICD-10-CM | POA: Diagnosis not present

## 2019-12-06 DIAGNOSIS — Z87891 Personal history of nicotine dependence: Secondary | ICD-10-CM | POA: Diagnosis not present

## 2019-12-06 DIAGNOSIS — E871 Hypo-osmolality and hyponatremia: Secondary | ICD-10-CM | POA: Diagnosis not present

## 2019-12-06 DIAGNOSIS — H409 Unspecified glaucoma: Secondary | ICD-10-CM | POA: Diagnosis not present

## 2019-12-06 DIAGNOSIS — I10 Essential (primary) hypertension: Secondary | ICD-10-CM | POA: Diagnosis not present

## 2019-12-18 DIAGNOSIS — E538 Deficiency of other specified B group vitamins: Secondary | ICD-10-CM | POA: Diagnosis not present

## 2019-12-18 DIAGNOSIS — E039 Hypothyroidism, unspecified: Secondary | ICD-10-CM | POA: Diagnosis not present

## 2019-12-18 DIAGNOSIS — E871 Hypo-osmolality and hyponatremia: Secondary | ICD-10-CM | POA: Diagnosis not present

## 2019-12-18 DIAGNOSIS — I639 Cerebral infarction, unspecified: Secondary | ICD-10-CM | POA: Diagnosis not present

## 2019-12-25 DIAGNOSIS — Z0000 Encounter for general adult medical examination without abnormal findings: Secondary | ICD-10-CM | POA: Diagnosis not present

## 2019-12-25 DIAGNOSIS — E538 Deficiency of other specified B group vitamins: Secondary | ICD-10-CM | POA: Diagnosis not present

## 2019-12-25 DIAGNOSIS — E871 Hypo-osmolality and hyponatremia: Secondary | ICD-10-CM | POA: Diagnosis not present

## 2019-12-25 DIAGNOSIS — I69854 Hemiplegia and hemiparesis following other cerebrovascular disease affecting left non-dominant side: Secondary | ICD-10-CM | POA: Diagnosis not present

## 2019-12-25 DIAGNOSIS — M76899 Other specified enthesopathies of unspecified lower limb, excluding foot: Secondary | ICD-10-CM | POA: Diagnosis not present

## 2020-01-10 DIAGNOSIS — H40052 Ocular hypertension, left eye: Secondary | ICD-10-CM | POA: Diagnosis not present

## 2020-01-27 ENCOUNTER — Emergency Department: Payer: PPO

## 2020-01-27 ENCOUNTER — Observation Stay
Admission: EM | Admit: 2020-01-27 | Discharge: 2020-01-28 | Disposition: A | Discharge status: Home / Self Care | Payer: PPO | Attending: Internal Medicine | Admitting: Internal Medicine

## 2020-01-27 ENCOUNTER — Observation Stay: Payer: PPO

## 2020-01-27 ENCOUNTER — Encounter: Payer: Self-pay | Admitting: Emergency Medicine

## 2020-01-27 ENCOUNTER — Other Ambulatory Visit: Payer: Self-pay

## 2020-01-27 DIAGNOSIS — Z7982 Long term (current) use of aspirin: Secondary | ICD-10-CM | POA: Insufficient documentation

## 2020-01-27 DIAGNOSIS — R55 Syncope and collapse: Secondary | ICD-10-CM | POA: Insufficient documentation

## 2020-01-27 DIAGNOSIS — K219 Gastro-esophageal reflux disease without esophagitis: Secondary | ICD-10-CM | POA: Diagnosis not present

## 2020-01-27 DIAGNOSIS — M6281 Muscle weakness (generalized): Secondary | ICD-10-CM | POA: Diagnosis not present

## 2020-01-27 DIAGNOSIS — G459 Transient cerebral ischemic attack, unspecified: Secondary | ICD-10-CM | POA: Diagnosis not present

## 2020-01-27 DIAGNOSIS — I10 Essential (primary) hypertension: Secondary | ICD-10-CM | POA: Insufficient documentation

## 2020-01-27 DIAGNOSIS — I639 Cerebral infarction, unspecified: Secondary | ICD-10-CM | POA: Diagnosis present

## 2020-01-27 DIAGNOSIS — Z79899 Other long term (current) drug therapy: Secondary | ICD-10-CM | POA: Insufficient documentation

## 2020-01-27 DIAGNOSIS — Z87891 Personal history of nicotine dependence: Secondary | ICD-10-CM | POA: Diagnosis not present

## 2020-01-27 DIAGNOSIS — H409 Unspecified glaucoma: Secondary | ICD-10-CM | POA: Diagnosis not present

## 2020-01-27 DIAGNOSIS — R29810 Facial weakness: Secondary | ICD-10-CM | POA: Diagnosis not present

## 2020-01-27 DIAGNOSIS — E785 Hyperlipidemia, unspecified: Secondary | ICD-10-CM | POA: Diagnosis not present

## 2020-01-27 DIAGNOSIS — R404 Transient alteration of awareness: Secondary | ICD-10-CM | POA: Diagnosis not present

## 2020-01-27 DIAGNOSIS — Z20822 Contact with and (suspected) exposure to covid-19: Secondary | ICD-10-CM | POA: Diagnosis not present

## 2020-01-27 DIAGNOSIS — R531 Weakness: Secondary | ICD-10-CM | POA: Diagnosis not present

## 2020-01-27 DIAGNOSIS — Z791 Long term (current) use of non-steroidal anti-inflammatories (NSAID): Secondary | ICD-10-CM | POA: Insufficient documentation

## 2020-01-27 DIAGNOSIS — R4020 Unspecified coma: Secondary | ICD-10-CM | POA: Diagnosis not present

## 2020-01-27 LAB — CBC WITH DIFFERENTIAL/PLATELET
Abs Immature Granulocytes: 0.02 10*3/uL (ref 0.00–0.07)
Basophils Absolute: 0 10*3/uL (ref 0.0–0.1)
Basophils Relative: 1 %
Eosinophils Absolute: 0 10*3/uL (ref 0.0–0.5)
Eosinophils Relative: 0 %
HCT: 38 % (ref 36.0–46.0)
Hemoglobin: 12.7 g/dL (ref 12.0–15.0)
Immature Granulocytes: 1 %
Lymphocytes Relative: 17 %
Lymphs Abs: 0.7 10*3/uL (ref 0.7–4.0)
MCH: 31.6 pg (ref 26.0–34.0)
MCHC: 33.4 g/dL (ref 30.0–36.0)
MCV: 94.5 fL (ref 80.0–100.0)
Monocytes Absolute: 0.7 10*3/uL (ref 0.1–1.0)
Monocytes Relative: 17 %
Neutro Abs: 2.7 10*3/uL (ref 1.7–7.7)
Neutrophils Relative %: 64 %
Platelets: 263 10*3/uL (ref 150–400)
RBC: 4.02 MIL/uL (ref 3.87–5.11)
RDW: 13.6 % (ref 11.5–15.5)
WBC: 4.2 10*3/uL (ref 4.0–10.5)
nRBC: 0 % (ref 0.0–0.2)

## 2020-01-27 LAB — BASIC METABOLIC PANEL
Anion gap: 9 (ref 5–15)
BUN: 16 mg/dL (ref 8–23)
CO2: 26 mmol/L (ref 22–32)
Calcium: 8.9 mg/dL (ref 8.9–10.3)
Chloride: 100 mmol/L (ref 98–111)
Creatinine, Ser: 0.63 mg/dL (ref 0.44–1.00)
GFR calc Af Amer: >60 mL/min (ref >60)
GFR calc non Af Amer: >60 mL/min (ref >60)
Glucose, Bld: 105 mg/dL — ABNORMAL HIGH (ref 70–99)
Potassium: 3.6 mmol/L (ref 3.5–5.1)
Sodium: 135 mmol/L (ref 135–145)

## 2020-01-27 LAB — URINALYSIS, COMPLETE (UACMP) WITH MICROSCOPIC
Bacteria, UA: NONE SEEN
Bilirubin Urine: NEGATIVE
Glucose, UA: NEGATIVE mg/dL
Hgb urine dipstick: NEGATIVE
Ketones, ur: 20 mg/dL — AB
Nitrite: NEGATIVE
Protein, ur: 30 mg/dL — AB
Specific Gravity, Urine: 1.018 (ref 1.005–1.030)
pH: 5 (ref 5.0–8.0)

## 2020-01-27 LAB — TROPONIN I (HIGH SENSITIVITY)
Troponin I (High Sensitivity): 4 ng/L (ref <18)
Troponin I (High Sensitivity): 4 ng/L (ref <18)

## 2020-01-27 MED ORDER — ACETAMINOPHEN 160 MG/5ML PO SOLN
650.0000 mg | ORAL | Status: DC | PRN
  Filled 2020-01-27: qty 20.3

## 2020-01-27 MED ORDER — CRANBERRY 400 MG PO CAPS: 800.0000 mg | ORAL_CAPSULE | Freq: Two times a day (BID) | ORAL | Status: DC

## 2020-01-27 MED ORDER — VITAMIN B-12 1000 MCG PO TABS
1000.0000 ug | ORAL_TABLET | Freq: Every day | ORAL | Status: DC
  Administered 2020-01-28: 1000 ug via ORAL
  Filled 2020-01-27: qty 1

## 2020-01-27 MED ORDER — AMLODIPINE BESYLATE 5 MG PO TABS
5.0000 mg | ORAL_TABLET | Freq: Every day | ORAL | Status: DC
  Administered 2020-01-28: 5 mg via ORAL
  Filled 2020-01-27: qty 1

## 2020-01-27 MED ORDER — SODIUM CHLORIDE 1 G PO TABS
1.0000 g | ORAL_TABLET | Freq: Three times a day (TID) | ORAL | Status: DC
  Administered 2020-01-28 (×2): 1 g via ORAL
  Filled 2020-01-27 (×3): qty 1

## 2020-01-27 MED ORDER — VITAMIN D 25 MCG (1000 UNIT) PO TABS
1000.0000 [IU] | ORAL_TABLET | Freq: Every day | ORAL | Status: DC
  Administered 2020-01-28: 1000 [IU] via ORAL
  Filled 2020-01-27: qty 1

## 2020-01-27 MED ORDER — ASPIRIN EC 325 MG PO TBEC
325.0000 mg | DELAYED_RELEASE_TABLET | Freq: Every day | ORAL | Status: DC
  Administered 2020-01-28: 325 mg via ORAL
  Filled 2020-01-27: qty 1

## 2020-01-27 MED ORDER — ONDANSETRON HCL 4 MG/2ML IJ SOLN: 4.0000 mg | Freq: Three times a day (TID) | INTRAMUSCULAR | Status: DC | PRN

## 2020-01-27 MED ORDER — DICLOFENAC SODIUM 1 % TD GEL
4.0000 g | Freq: Four times a day (QID) | TRANSDERMAL | Status: DC | PRN
  Filled 2020-01-27: qty 100

## 2020-01-27 MED ORDER — ACETAMINOPHEN 650 MG RE SUPP: 650.0000 mg | RECTAL | Status: DC | PRN

## 2020-01-27 MED ORDER — STROKE: EARLY STAGES OF RECOVERY BOOK: Freq: Once | Status: AC

## 2020-01-27 MED ORDER — ACETAMINOPHEN 325 MG PO TABS: 650.0000 mg | ORAL_TABLET | ORAL | Status: DC | PRN

## 2020-01-27 MED ORDER — NON FORMULARY: 1.0000 [drp] | Freq: Every day | Status: DC

## 2020-01-27 MED ORDER — ATORVASTATIN CALCIUM 20 MG PO TABS: 40.0000 mg | ORAL_TABLET | Freq: Every day | ORAL | Status: DC

## 2020-01-27 MED ORDER — PANTOPRAZOLE SODIUM 40 MG PO TBEC
40.0000 mg | DELAYED_RELEASE_TABLET | Freq: Every day | ORAL | Status: DC
  Administered 2020-01-28: 40 mg via ORAL
  Filled 2020-01-27: qty 1

## 2020-01-27 MED ORDER — ENOXAPARIN SODIUM 40 MG/0.4ML ~~LOC~~ SOLN: 40.0000 mg | SUBCUTANEOUS | Status: DC

## 2020-01-27 MED ORDER — ASCORBIC ACID 500 MG PO TABS
500.0000 mg | ORAL_TABLET | Freq: Every day | ORAL | Status: DC
  Administered 2020-01-28: 500 mg via ORAL
  Filled 2020-01-27: qty 1

## 2020-01-27 MED ORDER — SENNOSIDES-DOCUSATE SODIUM 8.6-50 MG PO TABS: 1.0000 | ORAL_TABLET | Freq: Every evening | ORAL | Status: DC | PRN

## 2020-01-27 NOTE — ED Triage Notes (Signed)
Pt arrived with EMS.  Pt felt like she was going to pass out and was helped to a table.  Per EMS, patient was passed out upon their arrival. Pt alert and oriented currently.

## 2020-01-27 NOTE — Plan of Care (Signed)
Pt does not currently have any deficit. Will continue to monitor.

## 2020-01-27 NOTE — Progress Notes (Signed)
PHARMACIST - PHYSICIAN ORDER COMMUNICATION  CONCERNING: P&T Medication Policy on Herbal Medications  DESCRIPTION:  This patient's order for:  Cranberry  has been noted.  This product(s) is classified as an "herbal" or natural product. Due to a lack of definitive safety studies or FDA approval, nonstandard manufacturing practices, plus the potential risk of unknown drug-drug interactions while on inpatient medications, the Pharmacy and Therapeutics Committee does not permit the use of "herbal" or natural products of this type within [place].   ACTION TAKEN: The pharmacy department is unable to verify this order at this time and your patient has been informed of this safety policy. Please reevaluate patient's clinical condition at discharge and address if the herbal or natural product(s) should be resumed at that time.   

## 2020-01-27 NOTE — Consult Note (Signed)
Reason for Consult:TIA Requesting Physician: Blaine Hamper  CC: Syncope  I have been asked by Dr. Blaine Hamper to see this patient in consultation for TIA.  HPI: Robin Fernandez is an 86 y.o. female with a history of stroke in December of 2020, glaucoma and GERD who was at home with her aide today and while working in the kitchen began to feel as if she were going to pass out.  When she came back around she was noted to have a left facial droop, left sided weakness and difficulty speaking.  EMS was called at that time.  At the time of arrival to ED patient was back to baseline.     Past Medical History:  Diagnosis Date  . GERD (gastroesophageal reflux disease)   . Glaucoma   . Vasovagal syncope     Past Surgical History:  Procedure Laterality Date  . ABDOMINAL HYSTERECTOMY    . EYE SURGERY      Family History  Problem Relation Age of Onset  . Transient ischemic attack Mother     Social History:  reports that she has quit smoking. Her smoking use included cigarettes. She has never used smokeless tobacco. She reports that she does not drink alcohol or use drugs.  Allergies  Allergen Reactions  . Salicylates Hives    Aspirin-like analgesic, salicylates group--urticaria  . Alendronate Other (See Comments)  . Amoxil [Amoxicillin] Other (See Comments)    Diarrhea, Blood in Stool  . Azopt [Brinzolamide] Other (See Comments)    Tingling in arms and feet  . Deltasone [Prednisone]     Affects blood pressure  . Erythromycin Other (See Comments)    Diarrhea, Blood in Stool   . Lumigan [Bimatoprost]     Nervousness  . Meloxicam Other (See Comments)  . Ofloxacin Other (See Comments)  . Parafon Forte Dsc [Chlorzoxazone] Other (See Comments)    "made very sick"  . Prevacid [Lansoprazole] Swelling    Medications: I have reviewed the patient's current medications. Prior to Admission medications   Medication Sig Start Date End Date Taking? Authorizing Provider  amLODipine (NORVASC) 5 MG tablet Take  1 tablet (5 mg total) by mouth daily. 11/10/19 01/27/20 Yes Dhungel, Nishant, MD  aspirin EC 325 MG tablet Take 1 tablet (325 mg total) by mouth daily. 11/06/19 11/05/20 Yes Dhungel, Nishant, MD  cholecalciferol (VITAMIN D3) 25 MCG (1000 UNIT) tablet Take 1,000 Units by mouth daily.   Yes [provider]  Cranberry 400 MG CAPS Take 800 mg by mouth 2 (two) times daily.   Yes [provider]  diclofenac sodium (VOLTAREN) 1 % GEL Apply 4 g topically 4 (four) times daily as needed (pain).    Yes [provider]  omeprazole (PRILOSEC) 20 MG capsule Take 20 mg by mouth daily.    Yes [provider]  sodium chloride 1 g tablet Take 1 g by mouth 3 (three) times daily with meals.    Yes [provider]  Travoprost, BAK Free, (TRAVATAN) 0.004 % SOLN ophthalmic solution Place 1 drop into the left eye at bedtime.   Yes [provider]  vitamin B-12 (CYANOCOBALAMIN) 1000 MCG tablet Take 1,000 mcg by mouth daily.    Yes [provider]  vitamin C (ASCORBIC ACID) 500 MG tablet Take 500 mg by mouth daily.   Yes [provider]     ROS: History obtained from the patient  General ROS: negative for - chills, fatigue, fever, night sweats, weight gain or weight loss Psychological  ROS: negative for - behavioral disorder, hallucinations, memory difficulties, mood swings or suicidal ideation Ophthalmic ROS: negative for - blurry vision, double vision, eye pain or loss of vision ENT ROS: negative for - epistaxis, nasal discharge, oral lesions, sore throat, tinnitus or vertigo Allergy and Immunology ROS: negative for - hives or itchy/watery eyes Hematological and Lymphatic ROS: negative for - bleeding problems, bruising or swollen lymph nodes Endocrine ROS: negative for - galactorrhea, hair pattern changes, polydipsia/polyuria or temperature intolerance Respiratory ROS: negative for - cough, hemoptysis, shortness of breath or wheezing Cardiovascular  ROS: negative for - chest pain, dyspnea on exertion, edema or irregular heartbeat Gastrointestinal ROS: negative for - abdominal pain, diarrhea, hematemesis, nausea/vomiting or stool incontinence Genito-Urinary ROS: negative for - dysuria, hematuria, incontinence or urinary frequency/urgency Musculoskeletal ROS: negative for - joint swelling or muscular weakness Neurological ROS: as noted in HPI Dermatological ROS: negative for rash and skin lesion changes  Physical Examination: Blood pressure (!) 127/54, pulse 77, temperature 98.5 F (36.9 C), temperature source Oral, resp. rate 20, SpO2 100 %.  HEENT-  Normocephalic, no lesions, without obvious abnormality.  Normal external eye and conjunctiva.  Normal TM's bilaterally.  Normal auditory canals and external ears. Normal external nose, mucus membranes and septum.  Normal pharynx. Cardiovascular- S1, S2 normal, pulses palpable throughout   Lungs- chest clear, no wheezing, rales, normal symmetric air entry Abdomen- soft, non-tender; bowel sounds normal; no masses,  no organomegaly Extremities- no edema Lymph-no adenopathy palpable Musculoskeletal-no joint tenderness, deformity or swelling Skin-warm and dry, no hyperpigmentation, vitiligo, or suspicious lesions  Neurological Examination   Mental Status: Alert, oriented, thought content appropriate.  Speech fluent without evidence of aphasia.  Able to follow 3 step commands without difficulty. Cranial Nerves: II: Visual fields grossly normal III,IV, VI: ptosis not present, extra-ocular motions intact bilaterally V,VII: smile symmetric, facial light touch sensation normal bilaterally VIII: hearing normal bilaterally IX,X: gag reflex present XI: bilateral shoulder shrug XII: midline tongue extension Motor: Right : Upper extremity   5/5    Left:     Upper extremity   5/5  Lower extremity   5/5     Lower extremity   5/5 Tone and bulk:normal tone throughout; no atrophy noted Sensory:  Pinprick and light touch intact throughout, bilaterally Deep Tendon Reflexes: Symmetric throughout Plantars: Right: mute   Left: mute Cerebellar: Normal finger-to-nose and normal heel-to-shin testing bilaterally Gait: not tested due to safety concerns    Laboratory Studies:   Basic Metabolic Panel: Recent Labs  Lab 01/27/20 1413  NA 135  K 3.6  CL 100  CO2 26  GLUCOSE 105*  BUN 16  CREATININE 0.63  CALCIUM 8.9    Liver Function Tests: No results for input(s): AST, ALT, ALKPHOS, BILITOT, PROT, ALBUMIN in the last 168 hours. No results for input(s): LIPASE, AMYLASE in the last 168 hours. No results for input(s): AMMONIA in the last 168 hours.  CBC: Recent Labs  Lab 01/27/20 1413  WBC 4.2  NEUTROABS 2.7  HGB 12.7  HCT 38.0  MCV 94.5  PLT 263    Cardiac Enzymes: No results for input(s): CKTOTAL, CKMB, CKMBINDEX, TROPONINI in the last 168 hours.  BNP: Invalid input(s): POCBNP  CBG: No results for input(s): GLUCAP in the last 168 hours.  Microbiology: Results for orders placed or performed during the hospital encounter of 11/03/19  SARS CORONAVIRUS 2 (TAT 6-24 HRS) Nasopharyngeal Nasopharyngeal Swab     Status: None   Collection Time: 11/03/19 11:29 PM   Specimen:  Nasopharyngeal Swab  Result Value Ref Range Status   SARS Coronavirus 2 NEGATIVE NEGATIVE Final    Comment: (NOTE) SARS-CoV-2 target nucleic acids are NOT DETECTED. The SARS-CoV-2 RNA is generally detectable in upper and lower respiratory specimens during the acute phase of infection. Negative results do not preclude SARS-CoV-2 infection, do not rule out co-infections with other pathogens, and should not be used as the sole basis for treatment or other patient management decisions. Negative results must be combined with clinical observations, patient history, and epidemiological information. The expected result is Negative. Fact Sheet for Patients: SugarRoll.be Fact  Sheet for Healthcare Providers: https://www.woods-mathews.com/ This test is not yet approved or cleared by the [place] FDA and  has been authorized for detection and/or diagnosis of SARS-CoV-2 by FDA under an Emergency Use Authorization (EUA). This EUA will remain  in effect (meaning this test can be used) for the duration of the COVID-19 declaration under Section 56 4(b)(1) of the Act, 21 U.S.C. section 360bbb-3(b)(1), unless the authorization is terminated or revoked sooner. Performed at [facility], [place] [address], [place], [place] [address]     Coagulation Studies: No results for input(s): LABPROT, INR in the last 72 hours.  Urinalysis:  Recent Labs  Lab 01/27/20 1413  COLORURINE AMBER*  LABSPEC 1.018  PHURINE 5.0  GLUCOSEU NEGATIVE  HGBUR NEGATIVE  BILIRUBINUR NEGATIVE  KETONESUR 20*  PROTEINUR 30*  NITRITE NEGATIVE  LEUKOCYTESUR TRACE*    Lipid Panel:     Component Value Date/Time   CHOL 162 11/04/2019 0547   TRIG 47 11/04/2019 0547   HDL 36 (L) 11/04/2019 0547   CHOLHDL 4.5 11/04/2019 0547   VLDL 9 11/04/2019 0547   LDLCALC 117 (H) 11/04/2019 0547    HgbA1C:  Lab Results  Component Value Date   HGBA1C 5.7 (H) 11/05/2019    Urine Drug Screen:  No results found for: LABOPIA, COCAINSCRNUR, LABBENZ, AMPHETMU, THCU, LABBARB  Alcohol Level: No results for input(s): ETH in the last 168 hours.  Other results: EKG: sinus rhythm at 70 bpm.  Imaging: CT Head Wo Contrast  Result Date: 01/27/2020 CLINICAL DATA:  Syncope EXAM: CT HEAD WITHOUT CONTRAST TECHNIQUE: Contiguous axial images were obtained from the base of the skull through the vertex without intravenous contrast. COMPARISON:  CT brain, 11/03/2019, MR brain, 11/04/2019 FINDINGS: Brain: No evidence of acute infarction, hemorrhage, hydrocephalus, extra-axial collection or mass lesion/mass effect. Periventricular and deep white matter hypodensity with new encephalomalacia of the right  corona radiata. Vascular: No hyperdense vessel or unexpected calcification. Skull: Normal. Negative for fracture or focal lesion. Sinuses/Orbits: No acute finding. Other: None. IMPRESSION: 1. No acute intracranial pathology. 2. Small-vessel white matter disease with new encephalomalacia of the right corona radiata, in keeping with acute infarction identified by previous MR. Electronically Signed   By: Eddie Candle M.D.   On: 01/27/2020 14:07     Assessment/Plan: 86 year old female with a history of stroke in December of 2020 who was at home today and had a syncopal episode after which patient was noted to have left facial droop, left sided weakness and difficulty with speech.  Symptoms have now resolved.  Head CT personally reviewed and shows no acute abnormalities.  Symptoms very similar to her presentation in December when she was noted to have an acute right BG infarct.  BP is relatively low and  She seems dehydrated with tenting of skin noted.  Patient may have had unmasking of her symptoms with decrease in BP that  caused syncope.  Would rule out arrhythmia and seizure as well.  TIA/acute infarct remain on the differential as well.    Recommendations: 1. EEG 2. MRI of the brain without contrast.  MRA from December 24 shows some atherosclerosis but no significant stenosis.  No indication to repeat at this time.   3. Echocardiogram 4. Orthostatic vitals 5. Continue ASA  6. Fasting lipid panel.  Last LDL elevated.  Would start high potency statin with target LDL<70. 7. Hydrate as tolerated 8. Telemetry 9. Frequent neuro checks    Alexis Goodell, MD Neurology [phone] 01/27/2020, 5:26 PM

## 2020-01-27 NOTE — H&P (Signed)
History and Physical    Robin Fernandez [id number] DOB: 1929-08-25 DOA: 01/27/2020  Referring MD/NP/PA:   PCP: Rusty Aus, MD   Patient coming from:  The patient is coming from home.  At baseline, pt is independent for most of ADL.        Chief Complaint: syncope, and left facial droop, aphasia, left-sided weakness  HPI: Robin Fernandez is a 86 y.o. female with medical history significant of stroke, hypertension, hyperlipidemia, glaucoma, who presents with syncope, left facial droop, aphasia, left-sided weakness.  Per her daughter I called her daughter by phone), patient passed out today, not sure how long patient passed out. When EMS arrived, patient was awake and alert, but seemed to have a left-sided facial droop, left-sided weakness and aphasia.  Symptoms rapidly improved with patient returning to her baseline mental status during EMS transportation.  Patient does not have chest pain, shortness of breath, cough, fever or chills.  No nausea vomiting, diarrhea, abdominal pain, symptoms of UTI  ED Course: pt was found to have WBC 4.2, pending COVID-19 PCR, electrolytes renal function okay, temperature normal, blood pressure 122/53, heart rate 69, RR 27, oxygen saturation 93 to 97% on room air.  CT head is negative for acute intracranial abnormalities.  Patient is placed on MedSurg bed for observation.  Neurology, Dr. Doy Mince was consulted.  Review of Systems:   General: no fevers, chills, no body weight gain, has fatigue HEENT: no blurry vision, hearing changes or sore throat Respiratory: no dyspnea, coughing, wheezing CV: no chest pain, no palpitations GI: no nausea, vomiting, abdominal pain, diarrhea, constipation GU: no dysuria, burning on urination, increased urinary frequency, hematuria  Ext: no leg edema Neuro: has syncope, left facial droop, aphasia, left-sided weakness, no vision change or hearing loss Skin: no rash, no skin tear. MSK: No muscle spasm, no deformity, no  limitation of range of movement in spin Heme: No easy bruising.  Travel history: No recent long distant travel.  Allergy:  Allergies  Allergen Reactions  . Salicylates Hives    Aspirin-like analgesic, salicylates group--urticaria  . Alendronate Other (See Comments)  . Amoxil [Amoxicillin] Other (See Comments)    Diarrhea, Blood in Stool  . Azopt [Brinzolamide] Other (See Comments)    Tingling in arms and feet  . Deltasone [Prednisone]     Affects blood pressure  . Erythromycin Other (See Comments)    Diarrhea, Blood in Stool   . Lumigan [Bimatoprost]     Nervousness  . Meloxicam Other (See Comments)  . Ofloxacin Other (See Comments)  . Parafon Forte Dsc [Chlorzoxazone] Other (See Comments)    "made very sick"  . Prevacid [Lansoprazole] Swelling    Past Medical History:  Diagnosis Date  . GERD (gastroesophageal reflux disease)   . Glaucoma   . Vasovagal syncope     Past Surgical History:  Procedure Laterality Date  . ABDOMINAL HYSTERECTOMY    . EYE SURGERY      Social History:  reports that she has quit smoking. Her smoking use included cigarettes. She has never used smokeless tobacco. She reports that she does not drink alcohol or use drugs.  Family History:  Family History  Problem Relation Age of Onset  . Transient ischemic attack Mother      Prior to Admission medications   Medication Sig Start Date End Date Taking? Authorizing Provider  amLODipine (NORVASC) 5 MG tablet Take 1 tablet (5 mg total) by mouth daily. 11/10/19 12/10/19  Dhungel, Flonnie Overman, MD  aspirin EC  325 MG tablet Take 1 tablet (325 mg total) by mouth daily. 11/06/19 11/05/20  Dhungel, Flonnie Overman, MD  atorvastatin (LIPITOR) 40 MG tablet Take 1 tablet (40 mg total) by mouth daily at 6 PM. 11/06/19   Dhungel, Nishant, MD  azelastine (ASTELIN) 0.1 % nasal spray Place 2 sprays into both nostrils 2 (two) times daily. 08/14/17   [provider]  Cranberry 400 MG CAPS Take 800 mg by mouth 2 (two) times  daily.    [provider]  diclofenac sodium (VOLTAREN) 1 % GEL Apply 4 g topically 4 (four) times daily. 01/29/19   [provider]  fluticasone (FLONASE) 50 MCG/ACT nasal spray Place 2 sprays into both nostrils daily. 07/11/18   [provider]  omeprazole (PRILOSEC) 20 MG capsule Take 1 capsule by mouth daily.  10/17/17   [provider]  sodium chloride 1 g tablet Take 1 g by mouth 3 (three) times daily. 04/19/18   [provider]  Travoprost, BAK Free, (TRAVATAN) 0.004 % SOLN ophthalmic solution Place 1 drop into the left eye at bedtime.    [provider]  vitamin B-12 (CYANOCOBALAMIN) 500 MCG tablet Take 1,000 mcg by mouth daily.    [provider]  vitamin C (ASCORBIC ACID) 500 MG tablet Take 500 mg by mouth daily.    [provider]    Physical Exam: Vitals:   01/27/20 1415 01/27/20 1500 01/27/20 1600 01/27/20 1630  BP:  (!) 141/55 119/63 (!) 127/54  Pulse: 69 73 71 77  Resp: (!) 23 (!) 25 14 20   Temp:      TempSrc:      SpO2: 97% 97% 98% 100%   General: Not in acute distress HEENT:       Eyes: PERRL, EOMI, no scleral icterus.       ENT: No discharge from the ears and nose, no pharynx injection, no tonsillar enlargement.        Neck: No JVD, no bruit, no mass felt. Heme: No neck lymph node enlargement. Cardiac: S1/S2, RRR, No murmurs, No gallops or rubs. Respiratory: No rales, wheezing, rhonchi or rubs. GI: Soft, nondistended, nontender, no rebound pain, no organomegaly, BS present. GU: No hematuria Ext: No pitting leg edema bilaterally. 2+DP/PT pulse bilaterally. Musculoskeletal: No joint deformities, No joint redness or warmth, no limitation of ROM in spin. Skin: No rashes.  Neuro: Alert, oriented X3, cranial nerves II-XII grossly intact, moves all extremities normally. Muscle strength 5/5 in all extremities, sensation to light touch intact. Brachial reflex 2+ bilaterally. Psych: Patient is not psychotic,  no suicidal or hemocidal ideation.  Labs on Admission: I have personally reviewed following labs and imaging studies  CBC: Recent Labs  Lab 01/27/20 1413  WBC 4.2  NEUTROABS 2.7  HGB 12.7  HCT 38.0  MCV 94.5  PLT 99991111   Basic Metabolic Panel: Recent Labs  Lab 01/27/20 1413  NA 135  K 3.6  CL 100  CO2 26  GLUCOSE 105*  BUN 16  CREATININE 0.63  CALCIUM 8.9   GFR: CrCl cannot be calculated (Unknown ideal weight.). Liver Function Tests: No results for input(s): AST, ALT, ALKPHOS, BILITOT, PROT, ALBUMIN in the last 168 hours. No results for input(s): LIPASE, AMYLASE in the last 168 hours. No results for input(s): AMMONIA in the last 168 hours. Coagulation Profile: No results for input(s): INR, PROTIME in the last 168 hours. Cardiac Enzymes: No results for input(s): CKTOTAL, CKMB, CKMBINDEX, TROPONINI in the last 168 hours. BNP (last 3 results)  No results for input(s): PROBNP in the last 8760 hours. HbA1C: No results for input(s): HGBA1C in the last 72 hours. CBG: No results for input(s): GLUCAP in the last 168 hours. Lipid Profile: No results for input(s): CHOL, HDL, LDLCALC, TRIG, CHOLHDL, LDLDIRECT in the last 72 hours. Thyroid Function Tests: No results for input(s): TSH, T4TOTAL, FREET4, T3FREE, THYROIDAB in the last 72 hours. Anemia Panel: No results for input(s): VITAMINB12, FOLATE, FERRITIN, TIBC, IRON, RETICCTPCT in the last 72 hours. Urine analysis:    Component Value Date/Time   COLORURINE AMBER (A) 01/27/2020 1413   APPEARANCEUR HAZY (A) 01/27/2020 1413   APPEARANCEUR Hazy 07/06/2014 1426   LABSPEC 1.018 01/27/2020 1413   LABSPEC 1.017 07/06/2014 1426   PHURINE 5.0 01/27/2020 1413   GLUCOSEU NEGATIVE 01/27/2020 1413   GLUCOSEU Negative 07/06/2014 1426   HGBUR NEGATIVE 01/27/2020 1413   BILIRUBINUR NEGATIVE 01/27/2020 1413   BILIRUBINUR Negative 07/06/2014 1426   KETONESUR 20 (A) 01/27/2020 1413   PROTEINUR 30 (A) 01/27/2020 1413   NITRITE  NEGATIVE 01/27/2020 1413   LEUKOCYTESUR TRACE (A) 01/27/2020 1413   LEUKOCYTESUR 3+ 07/06/2014 1426   Sepsis Labs: @LABRCNTIP (procalcitonin:4,lacticidven:4) )No results found for this or any previous visit (from the past 240 hour(s)).   Radiological Exams on Admission: CT Head Wo Contrast  Result Date: 01/27/2020 CLINICAL DATA:  Syncope EXAM: CT HEAD WITHOUT CONTRAST TECHNIQUE: Contiguous axial images were obtained from the base of the skull through the vertex without intravenous contrast. COMPARISON:  CT brain, 11/03/2019, MR brain, 11/04/2019 FINDINGS: Brain: No evidence of acute infarction, hemorrhage, hydrocephalus, extra-axial collection or mass lesion/mass effect. Periventricular and deep white matter hypodensity with new encephalomalacia of the right corona radiata. Vascular: No hyperdense vessel or unexpected calcification. Skull: Normal. Negative for fracture or focal lesion. Sinuses/Orbits: No acute finding. Other: None. IMPRESSION: 1. No acute intracranial pathology. 2. Small-vessel white matter disease with new encephalomalacia of the right corona radiata, in keeping with acute infarction identified by previous MR. Electronically Signed   By: Eddie Candle M.D.   On: 01/27/2020 14:07     EKG: Independently reviewed.  Sinus rhythm, early R wave progression, QTC 427  Assessment/Plan Principal Problem:   TIA (transient ischemic attack) Active Problems:   Syncope   HTN (hypertension)   HLD (hyperlipidemia)   Stroke (HCC)   GERD (gastroesophageal reflux disease)   Possible TIA: Patient had transient left facial droop, aphasia and left-sided weakness, indicating possible TIA.  CT head is negative for acute intracranial abnormalities.  Neurology, Dr. Doy Mince was consulted.  - will place on tele bed for obs - will follow up Neurology's Recs.  - Obtain MRI/MRA  - Check carotid dopplers  - Continue ASA and lipitor - fasting lipid panel and HbA1c  - 2D transthoracic  echocardiography  - swallowing screen. If fails, will get SLP - PT/OT consult -will get EEG due to syncope  Syncope: may be due to TIA -EEG -f/u MRA and 2D echo  HTN (hypertension) -continue felodipine  HLD (hyperlipidemia): -lipitor  Hx of Stroke (HCC) -ASA and lipitor  GERD (gastroesophageal reflux disease) -Protonix     DVT ppx: SQ Lovenox Code Status: Full code Family Communication: Yes, patient's daughter by phone Disposition Plan:  Anticipate discharge back to previous home environment Consults called: Dr. Doy Mince of neurology  admission status: Med-surg bed for obs   Date of Service 01/27/2020    [facility]   If 7PM-7AM, please contact night-coverage [email or web address] 01/27/2020, 5:51 PM

## 2020-01-27 NOTE — ED Provider Notes (Addendum)
[facility] Emergency Department Provider Note   ____________________________________________   First MD Initiated Contact with Patient 01/27/20 1330     (approximate)  I have reviewed the triage vital signs and the nursing notes.   HISTORY  Chief Complaint Near Syncope    HPI Robin Fernandez is a 86 y.o. female with past medical history of stroke who presents to the ED for syncope and weakness.  EMS was called to patient's home when her caregiver noted her to have passed out.  When EMS arrived, patient was awake and alert but seemed to have a left-sided facial droop along with left-sided weakness and aphasia.  Symptoms rapidly improved with patient returning to her baseline mental status during EMS transportation.  EMS stated that she no longer appeared weak on one side and was no longer having difficulty finding her words.  Patient now states that she feels fine, denies any recent fevers, cough, chest pain, shortness of breath, dysuria, or hematuria.  She does have a history of stroke and takes full dose aspirin regularly but does not take any blood thinners.  She last took her aspirin earlier this morning.        Past Medical History:  Diagnosis Date  . GERD (gastroesophageal reflux disease)   . Glaucoma   . Vasovagal syncope     Patient Active Problem List   Diagnosis Date Noted  . Acute ischemic stroke ([place]) 11/06/2019  . Hypertensive urgency 11/06/2019  . Altered mental status 11/04/2019  . TIA (transient ischemic attack) 11/19/2017  . Syncope 05/04/2017    Past Surgical History:  Procedure Laterality Date  . ABDOMINAL HYSTERECTOMY    . EYE SURGERY      Prior to Admission medications   Medication Sig Start Date End Date Taking? Authorizing Provider  amLODipine (NORVASC) 5 MG tablet Take 1 tablet (5 mg total) by mouth daily. 11/10/19 12/10/19  Dhungel, Flonnie Overman, MD  aspirin EC 325 MG tablet Take 1 tablet (325 mg total) by mouth daily.  11/06/19 11/05/20  Dhungel, Flonnie Overman, MD  atorvastatin (LIPITOR) 40 MG tablet Take 1 tablet (40 mg total) by mouth daily at 6 PM. 11/06/19   Dhungel, Nishant, MD  azelastine (ASTELIN) 0.1 % nasal spray Place 2 sprays into both nostrils 2 (two) times daily. 08/14/17   [provider]  Cranberry 400 MG CAPS Take 800 mg by mouth 2 (two) times daily.    [provider]  diclofenac sodium (VOLTAREN) 1 % GEL Apply 4 g topically 4 (four) times daily. 01/29/19   [provider]  fluticasone (FLONASE) 50 MCG/ACT nasal spray Place 2 sprays into both nostrils daily. 07/11/18   [provider]  omeprazole (PRILOSEC) 20 MG capsule Take 1 capsule by mouth daily.  10/17/17   [provider]  sodium chloride 1 g tablet Take 1 g by mouth 3 (three) times daily. 04/19/18   [provider]  Travoprost, BAK Free, (TRAVATAN) 0.004 % SOLN ophthalmic solution Place 1 drop into the left eye at bedtime.    [provider]  vitamin B-12 (CYANOCOBALAMIN) 500 MCG tablet Take 1,000 mcg by mouth daily.    [provider]  vitamin C (ASCORBIC ACID) 500 MG tablet Take 500 mg by mouth daily.    [provider]    Allergies Salicylates, Alendronate, Amoxil [amoxicillin], Azopt [brinzolamide], Deltasone [prednisone], Erythromycin, Lumigan [bimatoprost], Meloxicam, Ofloxacin, Parafon forte dsc [chlorzoxazone], and Prevacid [lansoprazole]  History reviewed. No pertinent family history.  Social History Social  History   Tobacco Use  . Smoking status: Former Smoker    Types: Cigarettes  . Smokeless tobacco: Never Used  Substance Use Topics  . Alcohol use: No  . Drug use: No    Review of Systems  Constitutional: No fever/chills Eyes: No visual changes. ENT: No sore throat. Cardiovascular: Denies chest pain.  Positive for syncope. Respiratory: Denies shortness of breath. Gastrointestinal: No abdominal pain.  No nausea, no vomiting.  No diarrhea.  No  constipation. Genitourinary: Negative for dysuria. Musculoskeletal: Negative for back pain. Skin: Negative for rash. Neurological: Negative for headaches.  Positive for left-sided weakness and aphasia.  ____________________________________________   PHYSICAL EXAM:  VITAL SIGNS: ED Triage Vitals  Enc Vitals Group     BP      Pulse      Resp      Temp      Temp src      SpO2      Weight      Height      Head Circumference      Peak Flow      Pain Score      Pain Loc      Pain Edu?      Excl. in [place]?     Constitutional: Alert and oriented x4. Eyes: Conjunctivae are normal. Head: Atraumatic. Nose: No congestion/rhinnorhea. Mouth/Throat: Mucous membranes are moist. Neck: Normal ROM Cardiovascular: Normal rate, regular rhythm. Grossly normal heart sounds. Respiratory: Normal respiratory effort.  No retractions. Lungs CTAB. Gastrointestinal: Soft and nontender. No distention. Genitourinary: deferred Musculoskeletal: No lower extremity tenderness nor edema. Neurologic:  Normal speech and language. No gross focal neurologic deficits are appreciated. Skin:  Skin is warm, dry and intact. No rash noted. Psychiatric: Mood and affect are normal. Speech and behavior are normal.  ____________________________________________   LABS (all labs ordered are listed, but only abnormal results are displayed)  Labs Reviewed  BASIC METABOLIC PANEL - Abnormal; Notable for the following components:      Result Value   Glucose, Bld 105 (*)    All other components within normal limits  SARS CORONAVIRUS 2 (TAT 6-24 HRS)  CBC WITH DIFFERENTIAL/PLATELET  URINALYSIS, COMPLETE (UACMP) WITH MICROSCOPIC  TROPONIN I (HIGH SENSITIVITY)  TROPONIN I (HIGH SENSITIVITY)   ____________________________________________  EKG  ED ECG REPORT I, Blake Divine, the attending physician, personally viewed and interpreted this ECG.   Date: 01/27/2020  EKG Time: 14:05  Rate: 70  Rhythm: normal sinus  rhythm  Axis: Normal  Intervals:none  ST&T Change: None   PROCEDURES  Procedure(s) performed (including Critical Care):  Procedures   ____________________________________________   INITIAL IMPRESSION / ASSESSMENT AND PLAN / ED COURSE       86 year old female with history of stroke on full dose aspirin presents to the ED following episode where she lost consciousness and awoke with left-sided facial droop, left-sided weakness, and aphasia.  Symptoms seem to resolve during EMS transport and she has returned to her baseline with no focal neuro deficits here in the ED.  CT head is negative for acute process and lab work is unremarkable, no electrolyte abnormality to explain her symptoms.  EKG without evidence of arrhythmia or ischemia.  She already took her full dose aspirin earlier this morning.  Case was discussed with Dr. Doy Mince of neurology, who agrees that best course of action would be to admit patient for further management.  Patient and daughter agree with plan.      ____________________________________________   FINAL CLINICAL IMPRESSION(S) /  ED DIAGNOSES  Final diagnoses:  TIA (transient ischemic attack)  Syncope, unspecified syncope type     ED Discharge Orders    None       Note:  This document was prepared using Dragon voice recognition software and may include unintentional dictation errors.   Blake Divine, MD 01/27/20 1519    Blake Divine, MD 01/27/20 [phone]

## 2020-01-27 NOTE — ED Notes (Signed)
Please call Tuwanna Polanco, daughter, with an update [phone]

## 2020-01-28 ENCOUNTER — Observation Stay (HOSPITAL_BASED_OUTPATIENT_CLINIC_OR_DEPARTMENT_OTHER)
Admit: 2020-01-28 | Discharge: 2020-01-28 | Disposition: A | Discharge status: Home / Self Care | Payer: PPO | Attending: Internal Medicine | Admitting: Internal Medicine

## 2020-01-28 DIAGNOSIS — I10 Essential (primary) hypertension: Secondary | ICD-10-CM | POA: Diagnosis not present

## 2020-01-28 DIAGNOSIS — K219 Gastro-esophageal reflux disease without esophagitis: Secondary | ICD-10-CM | POA: Diagnosis not present

## 2020-01-28 DIAGNOSIS — I340 Nonrheumatic mitral (valve) insufficiency: Secondary | ICD-10-CM

## 2020-01-28 DIAGNOSIS — I361 Nonrheumatic tricuspid (valve) insufficiency: Secondary | ICD-10-CM | POA: Diagnosis not present

## 2020-01-28 DIAGNOSIS — G459 Transient cerebral ischemic attack, unspecified: Secondary | ICD-10-CM | POA: Diagnosis not present

## 2020-01-28 DIAGNOSIS — R55 Syncope and collapse: Secondary | ICD-10-CM

## 2020-01-28 LAB — SARS CORONAVIRUS 2 (TAT 6-24 HRS): SARS Coronavirus 2: NEGATIVE

## 2020-01-28 LAB — ECHOCARDIOGRAM COMPLETE
Height: 58 in
Weight: 1876.8 oz

## 2020-01-28 LAB — LIPID PANEL
Cholesterol: 167 mg/dL (ref 0–200)
HDL: 27 mg/dL — ABNORMAL LOW (ref >40)
LDL Cholesterol: 129 mg/dL — ABNORMAL HIGH (ref 0–99)
Total CHOL/HDL Ratio: 6.2 RATIO
Triglycerides: 53 mg/dL (ref <150)
VLDL: 11 mg/dL (ref 0–40)

## 2020-01-28 LAB — HEMOGLOBIN A1C
Hgb A1c MFr Bld: 5.4 % (ref 4.8–5.6)
Mean Plasma Glucose: 108.28 mg/dL

## 2020-01-28 NOTE — Procedures (Signed)
ELECTROENCEPHALOGRAM REPORT   Patient: Robin Fernandez       Room #: 141A-AA EEG No. ID: [id number] Age: 86 y.o.        Sex: female Requesting Physician: Posey Pronto Report Date:  01/28/2020        Interpreting Physician: Alexis Goodell  History: Robin Fernandez is an 86 y.o. female with syncope  Medications:  Norvasc, Lipitor, Protonix  Conditions of Recording:  This is a 21 channel routine scalp EEG performed with bipolar and monopolar montages arranged in accordance to the international 10/20 system of electrode placement. One channel was dedicated to EKG recording.  The patient is in the awake, drowsy and asleep states.  Description:  The waking background activity consists of a low voltage, symmetrical, fairly well organized, 9-10 Hz alpha activity, seen from the parieto-occipital and posterior temporal regions.  Low voltage fast activity, poorly organized, is seen anteriorly and is at times superimposed on more posterior regions.  A mixture of theta and alpha rhythms are seen from the central and temporal regions. The patient drowses briefly with slowing to irregular, low voltage theta and beta activity.   The patient goes in to a light sleep briefly with symmetrical sleep spindles, vertex central sharp transients and irregular slow activity.  No epileptiform activity is noted.   Hyperventilation was not performed.  Intermittent photic stimulation was performed but failed to illicit any change in the tracing.    IMPRESSION: Normal electroencephalogram, awake, asleep and with activation procedures. There are no focal lateralizing or epileptiform features.   Alexis Goodell, MD Neurology [phone] 01/28/2020, 1:45 PM

## 2020-01-28 NOTE — Progress Notes (Signed)
*  PRELIMINARY RESULTS* Echocardiogram 2D Echocardiogram has been performed.  Robin Fernandez 01/28/2020, 9:49 AM

## 2020-01-28 NOTE — Evaluation (Signed)
Physical Therapy Evaluation Patient Details Name: Robin Fernandez MRN: [id number] DOB: 1929-05-04 Today's Date: 01/28/2020   History of Present Illness  Pt is an 86 y.o. female presenting to hospital 01/27/20 with syncope, weakness, L sided facial droop, L sided weakness, and aphasia (pt back to baseline upon arrival to ED per chart).  Pt admitted for possible TIA, syncope, and htn.  PMH includes chronic R basal ganglia infarct (Dec 2020), vasovagal syncope, AMS, TIA, hypertensive urgency.  Clinical Impression  Prior to hospital admission, pt was modified independent ambulating with RW; lives alone in 1 level home with ramp to enter; has PCA assistance.  Currently pt is modified independent with transfers with RW use and CGA with ambulation 200 feet with RW.  Generalized weakness and mild decreased activity tolerance noted from baseline.  No loss of balance noted during sessions activities.  OT reporting pt positive for orthostatics but asymptomatic during OT session; pt also asymptomatic during PT session.  Pt would benefit from skilled PT to address noted impairments and functional limitations during hospital stay (see below for any additional details).  Upon hospital discharge, no further PT needs anticipated.    Follow Up Recommendations No PT follow up    Equipment Recommendations  None recommended by PT(pt has needed DME at home already)    Recommendations for Other Services       Precautions / Restrictions Precautions Precautions: Fall Precaution Comments: Moderate fall; orthostatic Restrictions Weight Bearing Restrictions: No      Mobility  Bed Mobility             General bed mobility comments: Deferred (pt sitting in chair beginning and end of session)  Transfers Overall transfer level: Modified independent Equipment used: Rolling walker (2 wheeled)             General transfer comment: steady safe transfers noted from recliner with RW  use  Ambulation/Gait Ambulation/Gait assistance: Min guard Gait Distance (Feet): 200 Feet Assistive device: Rolling walker (2 wheeled)   Gait velocity: mildly decreased   General Gait Details: steady step through gait pattern  Stairs Stairs: (deferred (pt has ramp to enter home))          Wheelchair Mobility    Modified Rankin (Stroke Patients Only)       Balance Overall balance assessment: Needs assistance Sitting-balance support: No upper extremity supported;Feet supported Sitting balance-Leahy Scale: Normal Sitting balance - Comments: steady sitting reaching outside BOS   Standing balance support: No upper extremity supported Standing balance-Leahy Scale: Good Standing balance comment: steady standing taking off house coat end of session                             Pertinent Vitals/Pain Pain Assessment: No/denies pain  HR 74-81 bpm during sessions activities.    Home Living Family/patient expects to be discharged to:: Private residence Living Arrangements: Alone Available Help at Discharge: Family;Personal care attendant;Available 24 hours/day Type of Home: House Home Access: Ramped entrance     Home Layout: One level Home Equipment: Walker - 2 wheels;Cane - quad;Cane - single point;Wheelchair - manual Additional Comments: Pt reports she will have 24/7 assist available upon discharge home.    Prior Function Level of Independence: Needs assistance   Gait / Transfers Assistance Needed: Since pt's stroke in December, pt uses RW for ambulation and has had PCA's 24/7 assist; no falls in past 6 months.     Comments: Per OT "Pt reports  that prior to her stroke in December she was totally indep with amb community distances without an AD, drives, Ind with ADLs and IADL; Since her stroke, she has PCAs that assist 24/7; she uses a 2WW for household mobility; denies falls"     Hand Dominance   Dominant Hand: Right    Extremity/Trunk Assessment    Upper Extremity Assessment Upper Extremity Assessment: Defer to OT evaluation    Lower Extremity Assessment Lower Extremity Assessment: RLE deficits/detail;LLE deficits/detail(intact B LE light touch, heel to shin coordination, tone, and proprioception) RLE Deficits / Details: hip flexion, knee flexion/extension, and DF at least 4+/5 LLE Deficits / Details: hip flexion, knee flexion/extension, and DF at least 4/5    Cervical / Trunk Assessment Cervical / Trunk Assessment: Normal  Communication   Communication: HOH  Cognition Arousal/Alertness: Awake/alert Behavior During Therapy: WFL for tasks assessed/performed Overall Cognitive Status: Within Functional Limits for tasks assessed                                 General Comments: Pt reports feeling tired but otherwise back to baseline function.      General Comments      Exercises    Assessment/Plan    PT Assessment Patient needs continued PT services  PT Problem List Decreased strength;Decreased activity tolerance;Decreased mobility       PT Treatment Interventions DME instruction;Functional mobility training;Therapeutic activities;Therapeutic exercise;Balance training;Patient/family education    PT Goals (Current goals can be found in the Care Plan section)  Acute Rehab PT Goals Patient Stated Goal: To go home PT Goal Formulation: With patient Time For Goal Achievement: 02/11/20 Potential to Achieve Goals: Good    Frequency Min 2X/week   Barriers to discharge        Co-evaluation               AM-PAC PT "6 Clicks" Mobility  Outcome Measure Help needed turning from your back to your side while in a flat bed without using bedrails?: None Help needed moving from lying on your back to sitting on the side of a flat bed without using bedrails?: None Help needed moving to and from a bed to a chair (including a wheelchair)?: None Help needed standing up from a chair using your arms (e.g.,  wheelchair or bedside chair)?: None Help needed to walk in hospital room?: A Little Help needed climbing 3-5 steps with a railing? : A Little 6 Click Score: 22    End of Session Equipment Utilized During Treatment: Gait belt Activity Tolerance: Patient tolerated treatment well Patient left: in chair;with call bell/phone within reach;with chair alarm set;Other (comment)(B heels floating via pillow) Nurse Communication: Mobility status;Precautions PT Visit Diagnosis: Muscle weakness (generalized) (M62.81);Other abnormalities of gait and mobility (R26.89)    Time: [id number] PT Time Calculation (min) (ACUTE ONLY): 24 min   Charges:   PT Evaluation $PT Eval Low Complexity: 1 Low PT Treatments $Therapeutic Exercise: 8-22 mins       Leitha Bleak, PT 01/28/20, 12:09 PM

## 2020-01-28 NOTE — Discharge Summary (Signed)
[facility] [place] NAME: Robin Fernandez    MR#:  [id number]  DATE OF BIRTH:  [birth year]  DATE OF ADMISSION:  01/27/2020 ADMITTING PHYSICIAN: Ivor Costa, MD  DATE OF DISCHARGE: 01/28/2020  PRIMARY CARE PHYSICIAN: Rusty Aus, MD    ADMISSION DIAGNOSIS:  TIA (transient ischemic attack) [G45.9] Syncope, unspecified syncope type [R55]  DISCHARGE DIAGNOSIS:  TIA SECONDARY DIAGNOSIS:   Past Medical History:  Diagnosis Date  . GERD (gastroesophageal reflux disease)   . Glaucoma   . Vasovagal syncope     HOSPITAL COURSE:  Robin Fernandez is a 85 y.o. female with medical history significant of stroke, hypertension, hyperlipidemia, glaucoma, who presents with syncope, left facial droop, aphasia, left-sided weakness. Per her daughter I called her daughter by phone), patient passed out today, not sure how long patient passed out. When EMS arrived, patient was awake and alert, but seemed to have a left-sided facial droop, left-sided weakness and aphasia.   TIA: Patient had transient left facial droop, aphasia and left-sided weakness, indicating possible TIA.  CT head is negative for acute intracranial abnormalities.  Neurology, Dr. Doy Mince was consulted. -w/u with MRI and MRA brain negative for new CVA - Continue ASA  -pt intolerant to statins per dter - 2D transthoracic echocardiography EF 55%-60% - PT/OT consult--NO PT needs for home. Pt did well  Syncope: may be due to TIA vs mild orthostatic hypotension although pt asymptomatic and did very well with PT -EEG--negative  HTN (hypertension) -continue amlodipine qhs  HLD (hyperlipidemia): -pt intolerant to statins  Hx of Stroke (HCC) -ASA   GERD (gastroesophageal reflux disease) -Protonix  DVT ppx: SQ Lovenox Code Status: Full code Family Communication: Yes, patient's daughter by phone Disposition Plan: discharge back to previous home environment today Consults called:  Dr. Doy Mince of neurology    CONSULTS OBTAINED:  Treatment Team:  Catarina Hartshorn, MD Alexis Goodell, MD  DRUG ALLERGIES:   Allergies  Allergen Reactions  . Salicylates Hives    Aspirin-like analgesic, salicylates group--urticaria  . Alendronate Other (See Comments)  . Amoxil [Amoxicillin] Other (See Comments)    Diarrhea, Blood in Stool  . Azopt [Brinzolamide] Other (See Comments)    Tingling in arms and feet  . Deltasone [Prednisone]     Affects blood pressure  . Erythromycin Other (See Comments)    Diarrhea, Blood in Stool   . Lumigan [Bimatoprost]     Nervousness  . Meloxicam Other (See Comments)  . Ofloxacin Other (See Comments)  . Parafon Forte Dsc [Chlorzoxazone] Other (See Comments)    "made very sick"  . Prevacid [Lansoprazole] Swelling    DISCHARGE MEDICATIONS:   Allergies as of 01/28/2020      Reactions   Salicylates Hives   Aspirin-like analgesic, salicylates group--urticaria   Alendronate Other (See Comments)   Amoxil [amoxicillin] Other (See Comments)   Diarrhea, Blood in Stool   Azopt [brinzolamide] Other (See Comments)   Tingling in arms and feet   Deltasone [prednisone]    Affects blood pressure   Erythromycin Other (See Comments)   Diarrhea, Blood in Stool   Lumigan [bimatoprost]    Nervousness   Meloxicam Other (See Comments)   Ofloxacin Other (See Comments)   Parafon Forte Dsc [chlorzoxazone] Other (See Comments)   "made very sick"   Prevacid [lansoprazole] Swelling      Medication List    TAKE these medications   amLODipine 5 MG tablet Commonly known as: NORVASC Take 1 tablet (5  mg total) by mouth daily.   aspirin EC 325 MG tablet Take 1 tablet (325 mg total) by mouth daily.   cholecalciferol 25 MCG (1000 UNIT) tablet Commonly known as: VITAMIN D3 Take 1,000 Units by mouth daily.   Cranberry 400 MG Caps Take 800 mg by mouth 2 (two) times daily.   diclofenac sodium 1 % Gel Commonly known as: VOLTAREN Apply 4 g topically  4 (four) times daily as needed (pain).   omeprazole 20 MG capsule Commonly known as: PRILOSEC Take 20 mg by mouth daily.   sodium chloride 1 g tablet Take 1 g by mouth 3 (three) times daily with meals.   Travoprost (BAK Free) 0.004 % Soln ophthalmic solution Commonly known as: TRAVATAN Place 1 drop into the left eye at bedtime.   vitamin B-12 1000 MCG tablet Commonly known as: CYANOCOBALAMIN Take 1,000 mcg by mouth daily.   vitamin C 500 MG tablet Commonly known as: ASCORBIC ACID Take 500 mg by mouth daily.       If you experience worsening of your admission symptoms, develop shortness of breath, life threatening emergency, suicidal or homicidal thoughts you must seek medical attention immediately by calling 911 or calling your MD immediately  if symptoms less severe.  You Must read complete instructions/literature along with all the possible adverse reactions/side effects for all the Medicines you take and that have been prescribed to you. Take any new Medicines after you have completely understood and accept all the possible adverse reactions/side effects.   Please note  You were cared for by a hospitalist during your hospital stay. If you have any questions about your discharge medications or the care you received while you were in the hospital after you are discharged, you can call the unit and asked to speak with the hospitalist on call if the hospitalist that took care of you is not available. Once you are discharged, your primary care physician will handle any further medical issues. Please note that NO REFILLS for any discharge medications will be authorized once you are discharged, as it is imperative that you return to your primary care physician (or establish a relationship with a primary care physician if you do not have one) for your aftercare needs so that they can reassess your need for medications and monitor your lab values. Today   SUBJECTIVE   Doing well. No new  complaints Denies dizziness or dysarthria Ate well  VITAL SIGNS:  Blood pressure 131/69, pulse 73, temperature 98.2 F (36.8 C), temperature source Oral, resp. rate 19, height 4\' 10"  (1.473 m), weight 53.2 kg, SpO2 98 %.  I/O:    Intake/Output Summary (Last 24 hours) at 01/28/2020 1351 Last data filed at 01/28/2020 0942 Gross per 24 hour  Intake 290 ml  Output --  Net 290 ml    PHYSICAL EXAMINATION:  GENERAL:  86 y.o.-year-old patient lying in the bed with no acute distress.  EYES: Pupils equal, round, reactive to light and accommodation. No scleral icterus.  HEENT: Head atraumatic, normocephalic. Oropharynx and nasopharynx clear.  NECK:  Supple, no jugular venous distention. No thyroid enlargement, no tenderness.  LUNGS: Normal breath sounds bilaterally, no wheezing, rales,rhonchi or crepitation. No use of accessory muscles of respiration.  CARDIOVASCULAR: S1, S2 normal. No murmurs, rubs, or gallops.  ABDOMEN: Soft, non-tender, non-distended. Bowel sounds present. No organomegaly or mass.  EXTREMITIES: No pedal edema, cyanosis, or clubbing.  NEUROLOGIC: Cranial nerves II through XII are intact. Muscle strength 5/5 in all extremities. Sensation intact.  Gait not checked.  PSYCHIATRIC: The patient is alert and oriented x 3.  SKIN: No obvious rash, lesion, or ulcer.   DATA REVIEW:   CBC  Recent Labs  Lab 01/27/20 1413  WBC 4.2  HGB 12.7  HCT 38.0  PLT 263    Chemistries  Recent Labs  Lab 01/27/20 1413  NA 135  K 3.6  CL 100  CO2 26  GLUCOSE 105*  BUN 16  CREATININE 0.63  CALCIUM 8.9    Microbiology Results   Recent Results (from the past 240 hour(s))  SARS CORONAVIRUS 2 (TAT 6-24 HRS) Nasopharyngeal Nasopharyngeal Swab     Status: None   Collection Time: 01/27/20  3:18 PM   Specimen: Nasopharyngeal Swab  Result Value Ref Range Status   SARS Coronavirus 2 NEGATIVE NEGATIVE Final    Comment: (NOTE) SARS-CoV-2 target nucleic acids are NOT DETECTED. The  SARS-CoV-2 RNA is generally detectable in upper and lower respiratory specimens during the acute phase of infection. Negative results do not preclude SARS-CoV-2 infection, do not rule out co-infections with other pathogens, and should not be used as the sole basis for treatment or other patient management decisions. Negative results must be combined with clinical observations, patient history, and epidemiological information. The expected result is Negative. Fact Sheet for Patients: SugarRoll.be Fact Sheet for Healthcare Providers: https://www.woods-mathews.com/ This test is not yet approved or cleared by the [place] FDA and  has been authorized for detection and/or diagnosis of SARS-CoV-2 by FDA under an Emergency Use Authorization (EUA). This EUA will remain  in effect (meaning this test can be used) for the duration of the COVID-19 declaration under Section 56 4(b)(1) of the Act, 21 U.S.C. section 360bbb-3(b)(1), unless the authorization is terminated or revoked sooner. Performed at [facility], [address] [place], [place] [address]     RADIOLOGY:  EEG  Result Date: 01/28/2020 Alexis Goodell, MD     01/28/2020  1:47 PM ELECTROENCEPHALOGRAM REPORT Patient: PAYAL CHIHUAHUA       Room #: 141A-AA EEG No. ID: [id number] Age: 86 y.o.        Sex: female Requesting Physician: Posey Pronto Report Date:  01/28/2020       Interpreting Physician: Alexis Goodell History: MILLISSA MURNANE is an 85 y.o. female with syncope Medications: Norvasc, Lipitor, Protonix Conditions of Recording:  This is a 21 channel routine scalp EEG performed with bipolar and monopolar montages arranged in accordance to the international 10/20 system of electrode placement. One channel was dedicated to EKG recording. The patient is in the awake, drowsy and asleep states. Description:  The waking background activity consists of a low voltage, symmetrical, fairly well organized,  9-10 Hz alpha activity, seen from the parieto-occipital and posterior temporal regions.  Low voltage fast activity, poorly organized, is seen anteriorly and is at times superimposed on more posterior regions.  A mixture of theta and alpha rhythms are seen from the central and temporal regions. The patient drowses briefly with slowing to irregular, low voltage theta and beta activity.  The patient goes in to a light sleep briefly with symmetrical sleep spindles, vertex central sharp transients and irregular slow activity. No epileptiform activity is noted.  Hyperventilation was not performed.  Intermittent photic stimulation was performed but failed to illicit any change in the tracing. IMPRESSION: Normal electroencephalogram, awake, asleep and with activation procedures. There are no focal lateralizing or epileptiform features. Alexis Goodell, MD Neurology [phone] 01/28/2020, 1:45 PM   CT Head Wo Contrast  Result Date: 01/27/2020 CLINICAL DATA:  Syncope EXAM: CT HEAD WITHOUT CONTRAST TECHNIQUE: Contiguous axial images were obtained from the base of the skull through the vertex without intravenous contrast. COMPARISON:  CT brain, 11/03/2019, MR brain, 11/04/2019 FINDINGS: Brain: No evidence of acute infarction, hemorrhage, hydrocephalus, extra-axial collection or mass lesion/mass effect. Periventricular and deep white matter hypodensity with new encephalomalacia of the right corona radiata. Vascular: No hyperdense vessel or unexpected calcification. Skull: Normal. Negative for fracture or focal lesion. Sinuses/Orbits: No acute finding. Other: None. IMPRESSION: 1. No acute intracranial pathology. 2. Small-vessel white matter disease with new encephalomalacia of the right corona radiata, in keeping with acute infarction identified by previous MR. Electronically Signed   By: Eddie Candle M.D.   On: 01/27/2020 14:07   MR ANGIO HEAD WO CONTRAST  Result Date: 01/27/2020 CLINICAL DATA:  Initial evaluation for  acute episode of presyncope, left-sided facial droop, left-sided weakness and speech difficulty. EXAM: MRI HEAD WITHOUT CONTRAST MRA HEAD WITHOUT CONTRAST TECHNIQUE: Multiplanar, multiecho pulse sequences of the brain and surrounding structures were obtained without intravenous contrast. Angiographic images of the head were obtained using MRA technique without contrast. COMPARISON:  Prior CT from earlier the same day as well as recent MRI from 11/04/2019. FINDINGS: MRI HEAD FINDINGS Brain: Generalized age-related cerebral atrophy. Patchy T2/FLAIR hyperintensity within the periventricular and deep white matter both cerebral hemispheres most consistent with chronic small vessel ischemic disease, stable from previous. Patchy involvement of the pons noted. There has been interval evolution of previously seen infarct positioned at the right basal ganglia, now chronic in appearance. Additional small remote right cerebellar infarct noted. No abnormal foci of restricted diffusion to suggest acute or subacute ischemia. Gray-white matter differentiation maintained. No other areas of remote cortical infarction. No evidence for acute intracranial hemorrhage. Few scattered chronic micro hemorrhages noted clustered about the deep gray nuclei and brainstem, likely related to chronic underlying hypertension. No mass lesion, midline shift or mass effect. No hydrocephalus. No extra-axial fluid collection. Pituitary gland suprasellar region normal. Midline structures intact. Vascular: Major intracranial vascular flow voids are maintained. Skull and upper cervical spine: Craniocervical junction within normal limits. Upper cervical spine normal. Bone marrow signal intensity within normal limits. No scalp soft tissue abnormality. Sinuses/Orbits: Patient status post bilateral ocular lens replacement. Paranasal sinuses are largely clear. Small right mastoid effusion noted, of doubtful significance. Inner ear structures grossly within  normal limits. Other: None. MRA HEAD FINDINGS ANTERIOR CIRCULATION: Visualized distal cervical segments of the internal carotid arteries are patent with symmetric antegrade flow. Petrous segments widely patent. Scattered atheromatous irregularity within the cavernous/supraclinoid ICAs without hemodynamically significant stenosis. A1 segments patent bilaterally. Normal anterior communicating artery. Anterior cerebral arteries irregular but widely patent to their distal aspects without stenosis. No M1 stenosis or occlusion. Negative MCA bifurcations. Distal MCA branches well perfused and symmetric. Diffuse small vessel atheromatous irregularity. POSTERIOR CIRCULATION: Vertebral arteries patent to the vertebrobasilar junction without stenosis. Right vertebral artery slightly dominant. Patent left PICA. Right PICA not seen. Basilar widely patent to its distal aspect without stenosis. Superior cerebral arteries patent bilaterally. PCA supplied via the basilar as well as prominent bilateral posterior communicating arteries. Right PCA patent to its distal aspect without stenosis. Short-segment moderate left P2 stenosis again noted. Left PCA otherwise patent. Distal small vessel atheromatous irregularity. No intracranial aneurysm. IMPRESSION: MRI HEAD IMPRESSION: 1. No acute intracranial abnormality identified. 2. Interval evolution of recently identified right basal ganglia infarct, now chronic in appearance. 3. Underlying atrophy with chronic microvascular ischemic disease.  MRA HEAD IMPRESSION: 1. Negative intracranial MRA for large vessel occlusion. 2. Focal moderate approximate 50% stenosis involving the mid left P2 segment, stable. 3. Distal small vessel atheromatous irregularity. Electronically Signed   By: Jeannine Boga M.D.   On: 01/27/2020 21:43   MR BRAIN WO CONTRAST  Result Date: 01/27/2020 CLINICAL DATA:  Initial evaluation for acute episode of presyncope, left-sided facial droop, left-sided weakness  and speech difficulty. EXAM: MRI HEAD WITHOUT CONTRAST MRA HEAD WITHOUT CONTRAST TECHNIQUE: Multiplanar, multiecho pulse sequences of the brain and surrounding structures were obtained without intravenous contrast. Angiographic images of the head were obtained using MRA technique without contrast. COMPARISON:  Prior CT from earlier the same day as well as recent MRI from 11/04/2019. FINDINGS: MRI HEAD FINDINGS Brain: Generalized age-related cerebral atrophy. Patchy T2/FLAIR hyperintensity within the periventricular and deep white matter both cerebral hemispheres most consistent with chronic small vessel ischemic disease, stable from previous. Patchy involvement of the pons noted. There has been interval evolution of previously seen infarct positioned at the right basal ganglia, now chronic in appearance. Additional small remote right cerebellar infarct noted. No abnormal foci of restricted diffusion to suggest acute or subacute ischemia. Gray-white matter differentiation maintained. No other areas of remote cortical infarction. No evidence for acute intracranial hemorrhage. Few scattered chronic micro hemorrhages noted clustered about the deep gray nuclei and brainstem, likely related to chronic underlying hypertension. No mass lesion, midline shift or mass effect. No hydrocephalus. No extra-axial fluid collection. Pituitary gland suprasellar region normal. Midline structures intact. Vascular: Major intracranial vascular flow voids are maintained. Skull and upper cervical spine: Craniocervical junction within normal limits. Upper cervical spine normal. Bone marrow signal intensity within normal limits. No scalp soft tissue abnormality. Sinuses/Orbits: Patient status post bilateral ocular lens replacement. Paranasal sinuses are largely clear. Small right mastoid effusion noted, of doubtful significance. Inner ear structures grossly within normal limits. Other: None. MRA HEAD FINDINGS ANTERIOR CIRCULATION: Visualized  distal cervical segments of the internal carotid arteries are patent with symmetric antegrade flow. Petrous segments widely patent. Scattered atheromatous irregularity within the cavernous/supraclinoid ICAs without hemodynamically significant stenosis. A1 segments patent bilaterally. Normal anterior communicating artery. Anterior cerebral arteries irregular but widely patent to their distal aspects without stenosis. No M1 stenosis or occlusion. Negative MCA bifurcations. Distal MCA branches well perfused and symmetric. Diffuse small vessel atheromatous irregularity. POSTERIOR CIRCULATION: Vertebral arteries patent to the vertebrobasilar junction without stenosis. Right vertebral artery slightly dominant. Patent left PICA. Right PICA not seen. Basilar widely patent to its distal aspect without stenosis. Superior cerebral arteries patent bilaterally. PCA supplied via the basilar as well as prominent bilateral posterior communicating arteries. Right PCA patent to its distal aspect without stenosis. Short-segment moderate left P2 stenosis again noted. Left PCA otherwise patent. Distal small vessel atheromatous irregularity. No intracranial aneurysm. IMPRESSION: MRI HEAD IMPRESSION: 1. No acute intracranial abnormality identified. 2. Interval evolution of recently identified right basal ganglia infarct, now chronic in appearance. 3. Underlying atrophy with chronic microvascular ischemic disease. MRA HEAD IMPRESSION: 1. Negative intracranial MRA for large vessel occlusion. 2. Focal moderate approximate 50% stenosis involving the mid left P2 segment, stable. 3. Distal small vessel atheromatous irregularity. Electronically Signed   By: Jeannine Boga M.D.   On: 01/27/2020 21:43   ECHOCARDIOGRAM COMPLETE  Result Date: 01/28/2020    ECHOCARDIOGRAM REPORT   Patient Name:   DAUREEN WASSELL Date of Exam: 01/28/2020 Medical Rec #:  [id number]       Height:  58.0 in Accession #:    [id number]      Weight:       117.3 lb  Date of Birth:  01-03-1929       BSA:          1.452 m Patient Age:    13 years        BP:           131/69 mmHg Patient Gender: F               HR:           73 bpm. Exam Location:  [facility] Procedure: 2D Echo, Cardiac Doppler and Color Doppler Indications:     TIA 435.9  History:         Patient has prior history of Echocardiogram examinations, most                  recent 11/04/2019. GERD, vasovagal syncope.  Sonographer:     Sherrie Sport RDCS (AE) Referring Phys:  [id number] Soledad Gerlach NIU Diagnosing Phys: Ida Rogue MD IMPRESSIONS  1. Left ventricular ejection fraction, by estimation, is 60 to 65%. The left ventricle has normal function. The left ventricle has no regional wall motion abnormalities. Left ventricular diastolic parameters are consistent with Grade I diastolic dysfunction (impaired relaxation).  2. Right ventricular systolic function is normal. The right ventricular size is normal. There is mildly elevated pulmonary artery systolic pressure.  3. Left atrial size was mildly dilated. FINDINGS  Left Ventricle: Left ventricular ejection fraction, by estimation, is 60 to 65%. The left ventricle has normal function. The left ventricle has no regional wall motion abnormalities. The left ventricular internal cavity size was normal in size. There is  no left ventricular hypertrophy. Left ventricular diastolic parameters are consistent with Grade I diastolic dysfunction (impaired relaxation). Right Ventricle: The right ventricular size is normal. No increase in right ventricular wall thickness. Right ventricular systolic function is normal. There is mildly elevated pulmonary artery systolic pressure. The tricuspid regurgitant velocity is 2.43  m/s, and with an assumed right atrial pressure of 10 mmHg, the estimated right ventricular systolic pressure is 123XX123 mmHg. Left Atrium: Left atrial size was mildly dilated. Right Atrium: Right atrial size was normal in size. Pericardium: There is no evidence of pericardial effusion.  Mitral Valve: The mitral valve is normal in structure and function. Normal mobility of the mitral valve leaflets. Mild mitral valve regurgitation. No evidence of mitral valve stenosis. Tricuspid Valve: The tricuspid valve is normal in structure. Tricuspid valve regurgitation is mild . No evidence of tricuspid stenosis. Aortic Valve: The aortic valve is normal in structure and function. Aortic valve regurgitation is not visualized. No aortic stenosis is present. Aortic valve mean gradient measures 4.0 mmHg. Aortic valve peak gradient measures 8.0 mmHg. Aortic valve area, by VTI measures 2.40 cm. Pulmonic Valve: The pulmonic valve was normal in structure. Pulmonic valve regurgitation is mild. No evidence of pulmonic stenosis. Aorta: The aortic root is normal in size and structure. Venous: The inferior vena cava is normal in size with greater than 50% respiratory variability, suggesting right atrial pressure of 3 mmHg. IAS/Shunts: No atrial level shunt detected by color flow Doppler.  LEFT VENTRICLE PLAX 2D LVIDd:         3.76 cm  Diastology LVIDs:         2.69 cm  LV e' lateral:   4.24 cm/s LV PW:         1.28 cm  LV E/e'  lateral: 16.0 LV IVS:        1.18 cm  LV e' medial:    5.00 cm/s LVOT diam:     2.00 cm  LV E/e' medial:  13.6 LV SV:         62 LV SV Index:   43 LVOT Area:     3.14 cm  RIGHT VENTRICLE RV Basal diam:  3.39 cm RV S prime:     14.40 cm/s TAPSE (M-mode): 3.0 cm LEFT ATRIUM             Index       RIGHT ATRIUM           Index LA diam:        4.00 cm 2.76 cm/m  RA Area:     12.00 cm LA Vol (A2C):   72.4 ml 49.88 ml/m RA Volume:   24.90 ml  17.15 ml/m LA Vol (A4C):   66.8 ml 46.02 ml/m LA Biplane Vol: 71.2 ml 49.05 ml/m  AORTIC VALVE                   PULMONIC VALVE AV Area (Vmax):    2.35 cm    PV Vmax:        1.00 m/s AV Area (Vmean):   2.21 cm    PV Peak grad:   4.0 mmHg AV Area (VTI):     2.40 cm    RVOT Peak grad: 5 mmHg AV Vmax:           141.50 cm/s AV Vmean:          88.400 cm/s AV  VTI:            0.258 m AV Peak Grad:      8.0 mmHg AV Mean Grad:      4.0 mmHg LVOT Vmax:         106.00 cm/s LVOT Vmean:        62.200 cm/s LVOT VTI:          0.197 m LVOT/AV VTI ratio: 0.76  AORTA Ao Root diam: 3.00 cm MITRAL VALVE               TRICUSPID VALVE MV Area (PHT): 2.83 cm    TR Peak grad:   23.6 mmHg MV Decel Time: 268 msec    TR Vmax:        243.00 cm/s MV E velocity: 67.90 cm/s MV A velocity: 99.90 cm/s  SHUNTS MV E/A ratio:  0.68        Systemic VTI:  0.20 m                            Systemic Diam: 2.00 cm Ida Rogue MD Electronically signed by Ida Rogue MD Signature Date/Time: 01/28/2020/11:43:38 AM    Final      CODE STATUS:     Code Status Orders  (From admission, onward)         Start     Ordered   01/27/20 1830  Full code  Continuous     01/27/20 1829        Code Status History    Date Active Date Inactive Code Status Order ID Comments User Context   11/04/2019 [id number] 11/06/2019 1843 Full Code [id number]  Christel Mormon, MD ED   11/19/2017 1303 11/20/2017 1721 Full Code [id number]  Nicholes Mango, MD Inpatient   05/05/2017 0010 05/06/2017 1653 Full Code [id number]  Hugelmeyer, Alexis, DO Inpatient   Advance Care Planning Activity    Advance Directive Documentation     Most Recent Value  Type of Advance Directive  Healthcare Power of Attorney, Living will  Pre-existing out of facility DNR order (yellow form or pink MOST form)  --  "MOST" Form in Place?  --       TOTAL TIME TAKING CARE OF THIS PATIENT: *40* minutes.    Fritzi Mandes M.D  [facility]    CC: Primary care physician; Rusty Aus, MD

## 2020-01-28 NOTE — Progress Notes (Signed)
SLP Cancellation Note  Patient Details Name: Robin Fernandez MRN: [id number] DOB: September 10, 1929   Cancelled treatment:       Reason Eval/Treat Not Completed: SLP screened, no needs identified, will sign off(chart reviewed; consulted NSG then met w/ pt). Pt denied any difficulty swallowing and is currently on a regular diet; tolerates swallowing pills w/ water per NSG. Pt stated she has "Reflux" and has "to be careful with it". She endorsed hiccups(noted) and dry cough after meals. She tries to watch what she eats. Pt conversed at conversational level w/out deficits noted; pt and NSG denied any speech-language deficits.  No further skilled ST services indicated as pt appears at her baseline. Pt agreed. NSG to reconsult if any change in status while admitted.     Orinda Kenner, MS, CCC-SLP [place] Trillo 01/28/2020, 8:51 AM

## 2020-01-28 NOTE — Evaluation (Signed)
Occupational Therapy Evaluation Patient Details Name: Robin Fernandez MRN: [id number] DOB: 10-15-1929 Today's Date: 01/28/2020    History of Present Illness Pt is an 86 y.o. female with a known history of  glaucoma and GERD, and prior stroke (Dec. 2020) who presented to the emergency room via EMS after a caregiver noted she had an episode of syncope, left facial droop, aphasia, left-sided weakness.  MRI of the brain shows evolution of her previous BG infarct but no acute changes.   Clinical Impression   Robin Fernandez was seen for OT evaluation this date. Prior to hospital admission, pt received minimal assistance from her PCA for bathing and dressing tasks as well as IADL management. She reports using a 2WW for all mobility since her stroke in December of 2020, and denies falls history in past 12 months. Pt lives with alone in a 1-level home with a ramped entrance. Pt endorses having assistance available 24/7 if needed between her daughter and personal care attendants. MD requesting orthostatic vitals this date. Pt + for orthostatic BP drop, however remains a symptomatic t/o and denies adverse symptoms during fxl transfers. See chart below:   01/28/20 1012  Therapy Vitals  Patient Position (if appropriate) Orthostatic Vitals  Orthostatic Lying   BP- Lying 127/55  Pulse- Lying 77  Orthostatic Sitting  BP- Sitting 107/51  Pulse- Sitting 68 (Sitting after ~3 min 128/56 (78))  Orthostatic Standing at 0 minutes  BP- Standing at 0 minutes 106/54  Pulse- Standing at 0 minutes 70 (asymptomatic)  Orthostatic Standing at 3 minutes  BP- Standing at 3 minutes 114/57  Pulse- Standing at 3 minutes 74  Oxygen Therapy  SpO2 98 %  O2 Device Room Air   Currently pt reporting symptoms of facial droop, aphasia, and L sided weakness have resolved and, despite some ongoing fatigue form poor sleep, she feels back to her baseline level of independence to perform ADL tasks. She engages in LB dressing, fxl  mobility, toileting, and grooming this date all with supervision for safety but no physical assist required from this therapist. See ADL section below for details. Pt demonstrates baseline independence to perform ADL and mobility tasks and no strength, sensory, coordination, cognitive, or visual deficits appreciated with assessment. No skilled OT needs identified. Will sign off. Please re-consult if additional OT needs arise.     Follow Up Recommendations  No OT follow up    Equipment Recommendations  None recommended by OT(Pt has necessary equipment.)    Recommendations for Other Services       Precautions / Restrictions Precautions Precautions: Fall Precaution Comments: Moderate fall Restrictions Weight Bearing Restrictions: No      Mobility Bed Mobility Overal bed mobility: Modified Independent             General bed mobility comments: Pt comes to sitting at EOB with mild increased time/effort to perform. HOB slightly elevated this date.  Transfers Overall transfer level: Modified independent Equipment used: Rolling walker (2 wheeled)             General transfer comment: Pt performs fxl t/f w/o physical assist this date. OT provides supervision for safety and min cueing for hand/foot placement.    Balance Overall balance assessment: Needs assistance Sitting-balance support: Feet supported;No upper extremity supported Sitting balance-Leahy Scale: Good Sitting balance - Comments: Steady static/dynamic sitting at EOB while performing functional tasks. No LOB appreciated with weight shift/reaching outside BOS this date.   Standing balance support: No upper extremity supported;During functional activity Standing  balance-Leahy Scale: Good Standing balance comment: Steady static standing at sink to perform hand hygiene. No LOB appreciated with assessment.                           ADL either performed or assessed with clinical judgement   ADL Overall  ADL's : At baseline                                       General ADL Comments: Pt appears to be at or near baseline level of functional independence for ADL mgt. She ambulates to room bathroom using RW with supervision for safety and does not require physical assist for toileting/peri-care this date. Pt also able to button/unbutton pajama top when adjusting tele-monitor leads independently. She dons hospital socks on BLE while seated at EOB w/o LOB or need for physical assist this date. Pt endorses feeling back to baseline function, despite some fatigue from poor sleep overnight.     Vision Baseline Vision/History: Wears glasses Wears Glasses: At all times Patient Visual Report: No change from baseline Vision Assessment?: No apparent visual deficits     Perception     Praxis      Pertinent Vitals/Pain Pain Assessment: No/denies pain     Hand Dominance Right   Extremity/Trunk Assessment Upper Extremity Assessment Upper Extremity Assessment: Overall WFL for tasks assessed(BUE grossly WFLs, FMC WNLs, pt able to use RUE/LUE t/o session to button/unbutton clothing, use RW, manipulate room phone, etc. No functional deficits appreciated. Pt endorses LUE doing much better since prior stroke in Dec.)   Lower Extremity Assessment Lower Extremity Assessment: Defer to PT evaluation;Generalized weakness(Pt fatigues quickly with standing/functional mobility but does not require physical assist for fxl transfers/ADL mgt.)       Communication Communication Communication: HOH   Cognition Arousal/Alertness: Awake/alert Behavior During Therapy: WFL for tasks assessed/performed Overall Cognitive Status: Within Functional Limits for tasks assessed                                 General Comments: Pt endorses feeling generally fatigued, but otherwise back to baseline function t/o assessment.   General Comments       Exercises Other Exercises Other Exercises: Pt  educated in falls prevention strategies, safe use of AE/DME for ADL mgt, safe transfer technique, and routines modifications to support safety/satisfaction during meaningful occupations of daily life. Other Exercises: OT engages pt in fxl t/f to room bathroom. Pt able to perform fxl mobility, don socks, perform toileting, peri-care, and hand hygiene w/ supervision for safety. See ADL section for details. Other Exercises: Time to perform orthostatic vitals. x10 min.   Shoulder Instructions      Home Living Family/patient expects to be discharged to:: Private residence Living Arrangements: Alone Available Help at Discharge: Family;Personal care attendant;Available 24 hours/day Type of Home: House Home Access: Ramped entrance     Home Layout: One level     Bathroom Shower/Tub: Tub/shower unit;Walk-in shower   Bathroom Toilet: Handicapped height Bathroom Accessibility: Yes How Accessible: Accessible via walker Home Equipment: [place] - 2 wheels;Cane - quad;Cane - single point;Wheelchair - manual   Additional Comments: Per pt and daughter PCA in process of being hired and pt will have 24/7 assistance available upon return home      Prior Functioning/Environment Level of Independence:  Needs assistance        Comments: Pt reports that prior to her stroke in December she was totally indep with amb community distances without an AD, drives, Ind with ADLs and IADL; Since her stroke, she has PCAs that assist 24/7; she uses a 2WW for household mobility; denies falls        OT Problem List: Decreased strength;Decreased coordination;Decreased safety awareness;Decreased activity tolerance;Decreased knowledge of use of DME or AE;Impaired balance (sitting and/or standing)      OT Treatment/Interventions:      OT Goals(Current goals can be found in the care plan section) Acute Rehab OT Goals Patient Stated Goal: To go home OT Goal Formulation: All assessment and education complete, DC  therapy Time For Goal Achievement: 01/28/20 Potential to Achieve Goals: Good  OT Frequency:     Barriers to D/C:            Co-evaluation              AM-PAC OT "6 Clicks" Daily Activity     Outcome Measure Help from another person eating meals?: None Help from another person taking care of personal grooming?: None Help from another person toileting, which includes using toliet, bedpan, or urinal?: None Help from another person bathing (including washing, rinsing, drying)?: A Little Help from another person to put on and taking off regular upper body clothing?: None Help from another person to put on and taking off regular lower body clothing?: A Little 6 Click Score: 22   End of Session Equipment Utilized During Treatment: Gait belt;Rolling walker  Activity Tolerance: Patient tolerated treatment well Patient left: in chair;with call bell/phone within reach;with chair alarm set  OT Visit Diagnosis: Other abnormalities of gait and mobility (R26.89)                Time: [id number] OT Time Calculation (min): 47 min Charges:  OT General Charges $OT Visit: 1 Visit OT Evaluation $OT Eval Low Complexity: 1 Low OT Treatments $Self Care/Home Management : 23-37 mins  Shara Blazing, M.S., OTR/L Ascom: [phone] 01/28/20, 11:39 AM

## 2020-01-28 NOTE — Progress Notes (Signed)
eeg completed ° °

## 2020-01-28 NOTE — TOC Progression Note (Signed)
Transition of Care [facility]) - Progression Note    Patient Details  Name: Robin Fernandez MRN: [id number] Date of Birth: [birth year]  Transition of Care [facility]) CM/SW [place], RN Phone Number: 01/28/2020, 1:21 PM  Clinical Narrative:     The patient is at baseline and uses a RW at home, she has a 24/7 caregiver.  NO needs       Expected Discharge Plan and Services                                                 Social Determinants of Health (SDOH) Interventions    Readmission Risk Interventions No flowsheet data found.

## 2020-01-28 NOTE — Progress Notes (Signed)
Subjective: Patient reports no new neurological complaints.  Reports remaining at baseline.  Has been able to get up with her walker and go to the bathroom.    Objective: Current vital signs: BP 131/69 (BP Location: Right Arm)   Pulse 73   Temp 98.2 F (36.8 C) (Oral)   Resp 19   Ht 4\' 10"  (1.473 m)   Wt 53.2 kg   SpO2 98%   BMI 24.52 kg/m  Vital signs in last 24 hours: Temp:  [98.2 F (36.8 C)-98.8 F (37.1 C)] 98.2 F (36.8 C) (03/02 0730) Pulse Rate:  [67-77] 73 (03/02 0730) Resp:  [14-27] 19 (03/02 0730) BP: (116-141)/(48-83) 131/69 (03/02 0730) SpO2:  [93 %-100 %] 98 % (03/02 1012) Weight:  [53.2 kg] 53.2 kg (03/01 2106)  Intake/Output from previous day: No intake/output data recorded. Intake/Output this shift: Total I/O In: 530 [P.O.:530] Out: -  Nutritional status:  Diet Order            Diet Heart Room service appropriate? Yes; Fluid consistency: Thin  Diet effective now              Neurologic Exam: Mental Status: Alert, oriented, thought content appropriate.  Speech fluent without evidence of aphasia.  Able to follow 3 step commands without difficulty. Cranial Nerves: II: Visual fields grossly normal III,IV, VI: ptosis not present, extra-ocular motions intact bilaterally V,VII: smile symmetric, facial light touch sensation normal bilaterally VIII: hearing normal bilaterally IX,X: gag reflex present XI: bilateral shoulder shrug XII: midline tongue extension Motor: 5/5 throughout, bilaterally Deep Tendon Reflexes: Symmetric throughout Plantars: Right: mute                              Left: mute Cerebellar: Normal finger-to-nose and normal heel-to-shin testing bilaterally  Lab Results: Basic Metabolic Panel: Recent Labs  Lab 01/27/20 1413  NA 135  K 3.6  CL 100  CO2 26  GLUCOSE 105*  BUN 16  CREATININE 0.63  CALCIUM 8.9    Liver Function Tests: No results for input(s): AST, ALT, ALKPHOS, BILITOT, PROT, ALBUMIN in the last 168  hours. No results for input(s): LIPASE, AMYLASE in the last 168 hours. No results for input(s): AMMONIA in the last 168 hours.  CBC: Recent Labs  Lab 01/27/20 1413  WBC 4.2  NEUTROABS 2.7  HGB 12.7  HCT 38.0  MCV 94.5  PLT 263    Cardiac Enzymes: No results for input(s): CKTOTAL, CKMB, CKMBINDEX, TROPONINI in the last 168 hours.  Lipid Panel: Recent Labs  Lab 01/28/20 0500  CHOL 167  TRIG 53  HDL 27*  CHOLHDL 6.2  VLDL 11  LDLCALC 129*    CBG: No results for input(s): GLUCAP in the last 168 hours.  Microbiology: Results for orders placed or performed during the hospital encounter of 01/27/20  SARS CORONAVIRUS 2 (TAT 6-24 HRS) Nasopharyngeal Nasopharyngeal Swab     Status: None   Collection Time: 01/27/20  3:18 PM   Specimen: Nasopharyngeal Swab  Result Value Ref Range Status   SARS Coronavirus 2 NEGATIVE NEGATIVE Final    Comment: (NOTE) SARS-CoV-2 target nucleic acids are NOT DETECTED. The SARS-CoV-2 RNA is generally detectable in upper and lower respiratory specimens during the acute phase of infection. Negative results do not preclude SARS-CoV-2 infection, do not rule out co-infections with other pathogens, and should not be used as the sole basis for treatment or other patient management decisions. Negative results must be combined  with clinical observations, patient history, and epidemiological information. The expected result is Negative. Fact Sheet for Patients: SugarRoll.be Fact Sheet for Healthcare Providers: https://www.woods-mathews.com/ This test is not yet approved or cleared by the [place] FDA and  has been authorized for detection and/or diagnosis of SARS-CoV-2 by FDA under an Emergency Use Authorization (EUA). This EUA will remain  in effect (meaning this test can be used) for the duration of the COVID-19 declaration under Section 56 4(b)(1) of the Act, 21 U.S.C. section 360bbb-3(b)(1), unless  the authorization is terminated or revoked sooner. Performed at [facility], [place] [address], [place], [place] [address]     Coagulation Studies: No results for input(s): LABPROT, INR in the last 72 hours.  Imaging: CT Head Wo Contrast  Result Date: 01/27/2020 CLINICAL DATA:  Syncope EXAM: CT HEAD WITHOUT CONTRAST TECHNIQUE: Contiguous axial images were obtained from the base of the skull through the vertex without intravenous contrast. COMPARISON:  CT brain, 11/03/2019, MR brain, 11/04/2019 FINDINGS: Brain: No evidence of acute infarction, hemorrhage, hydrocephalus, extra-axial collection or mass lesion/mass effect. Periventricular and deep white matter hypodensity with new encephalomalacia of the right corona radiata. Vascular: No hyperdense vessel or unexpected calcification. Skull: Normal. Negative for fracture or focal lesion. Sinuses/Orbits: No acute finding. Other: None. IMPRESSION: 1. No acute intracranial pathology. 2. Small-vessel white matter disease with new encephalomalacia of the right corona radiata, in keeping with acute infarction identified by previous MR. Electronically Signed   By: Eddie Candle M.D.   On: 01/27/2020 14:07   MR ANGIO HEAD WO CONTRAST  Result Date: 01/27/2020 CLINICAL DATA:  Initial evaluation for acute episode of presyncope, left-sided facial droop, left-sided weakness and speech difficulty. EXAM: MRI HEAD WITHOUT CONTRAST MRA HEAD WITHOUT CONTRAST TECHNIQUE: Multiplanar, multiecho pulse sequences of the brain and surrounding structures were obtained without intravenous contrast. Angiographic images of the head were obtained using MRA technique without contrast. COMPARISON:  Prior CT from earlier the same day as well as recent MRI from 11/04/2019. FINDINGS: MRI HEAD FINDINGS Brain: Generalized age-related cerebral atrophy. Patchy T2/FLAIR hyperintensity within the periventricular and deep white matter both cerebral hemispheres most consistent with chronic  small vessel ischemic disease, stable from previous. Patchy involvement of the pons noted. There has been interval evolution of previously seen infarct positioned at the right basal ganglia, now chronic in appearance. Additional small remote right cerebellar infarct noted. No abnormal foci of restricted diffusion to suggest acute or subacute ischemia. Gray-white matter differentiation maintained. No other areas of remote cortical infarction. No evidence for acute intracranial hemorrhage. Few scattered chronic micro hemorrhages noted clustered about the deep gray nuclei and brainstem, likely related to chronic underlying hypertension. No mass lesion, midline shift or mass effect. No hydrocephalus. No extra-axial fluid collection. Pituitary gland suprasellar region normal. Midline structures intact. Vascular: Major intracranial vascular flow voids are maintained. Skull and upper cervical spine: Craniocervical junction within normal limits. Upper cervical spine normal. Bone marrow signal intensity within normal limits. No scalp soft tissue abnormality. Sinuses/Orbits: Patient status post bilateral ocular lens replacement. Paranasal sinuses are largely clear. Small right mastoid effusion noted, of doubtful significance. Inner ear structures grossly within normal limits. Other: None. MRA HEAD FINDINGS ANTERIOR CIRCULATION: Visualized distal cervical segments of the internal carotid arteries are patent with symmetric antegrade flow. Petrous segments widely patent. Scattered atheromatous irregularity within the cavernous/supraclinoid ICAs without hemodynamically significant stenosis. A1 segments patent bilaterally. Normal anterior communicating artery. Anterior cerebral arteries irregular but widely patent to their distal aspects  without stenosis. No M1 stenosis or occlusion. Negative MCA bifurcations. Distal MCA branches well perfused and symmetric. Diffuse small vessel atheromatous irregularity. POSTERIOR CIRCULATION:  Vertebral arteries patent to the vertebrobasilar junction without stenosis. Right vertebral artery slightly dominant. Patent left PICA. Right PICA not seen. Basilar widely patent to its distal aspect without stenosis. Superior cerebral arteries patent bilaterally. PCA supplied via the basilar as well as prominent bilateral posterior communicating arteries. Right PCA patent to its distal aspect without stenosis. Short-segment moderate left P2 stenosis again noted. Left PCA otherwise patent. Distal small vessel atheromatous irregularity. No intracranial aneurysm. IMPRESSION: MRI HEAD IMPRESSION: 1. No acute intracranial abnormality identified. 2. Interval evolution of recently identified right basal ganglia infarct, now chronic in appearance. 3. Underlying atrophy with chronic microvascular ischemic disease. MRA HEAD IMPRESSION: 1. Negative intracranial MRA for large vessel occlusion. 2. Focal moderate approximate 50% stenosis involving the mid left P2 segment, stable. 3. Distal small vessel atheromatous irregularity. Electronically Signed   By: Jeannine Boga M.D.   On: 01/27/2020 21:43   MR BRAIN WO CONTRAST  Result Date: 01/27/2020 CLINICAL DATA:  Initial evaluation for acute episode of presyncope, left-sided facial droop, left-sided weakness and speech difficulty. EXAM: MRI HEAD WITHOUT CONTRAST MRA HEAD WITHOUT CONTRAST TECHNIQUE: Multiplanar, multiecho pulse sequences of the brain and surrounding structures were obtained without intravenous contrast. Angiographic images of the head were obtained using MRA technique without contrast. COMPARISON:  Prior CT from earlier the same day as well as recent MRI from 11/04/2019. FINDINGS: MRI HEAD FINDINGS Brain: Generalized age-related cerebral atrophy. Patchy T2/FLAIR hyperintensity within the periventricular and deep white matter both cerebral hemispheres most consistent with chronic small vessel ischemic disease, stable from previous. Patchy involvement of the  pons noted. There has been interval evolution of previously seen infarct positioned at the right basal ganglia, now chronic in appearance. Additional small remote right cerebellar infarct noted. No abnormal foci of restricted diffusion to suggest acute or subacute ischemia. Gray-white matter differentiation maintained. No other areas of remote cortical infarction. No evidence for acute intracranial hemorrhage. Few scattered chronic micro hemorrhages noted clustered about the deep gray nuclei and brainstem, likely related to chronic underlying hypertension. No mass lesion, midline shift or mass effect. No hydrocephalus. No extra-axial fluid collection. Pituitary gland suprasellar region normal. Midline structures intact. Vascular: Major intracranial vascular flow voids are maintained. Skull and upper cervical spine: Craniocervical junction within normal limits. Upper cervical spine normal. Bone marrow signal intensity within normal limits. No scalp soft tissue abnormality. Sinuses/Orbits: Patient status post bilateral ocular lens replacement. Paranasal sinuses are largely clear. Small right mastoid effusion noted, of doubtful significance. Inner ear structures grossly within normal limits. Other: None. MRA HEAD FINDINGS ANTERIOR CIRCULATION: Visualized distal cervical segments of the internal carotid arteries are patent with symmetric antegrade flow. Petrous segments widely patent. Scattered atheromatous irregularity within the cavernous/supraclinoid ICAs without hemodynamically significant stenosis. A1 segments patent bilaterally. Normal anterior communicating artery. Anterior cerebral arteries irregular but widely patent to their distal aspects without stenosis. No M1 stenosis or occlusion. Negative MCA bifurcations. Distal MCA branches well perfused and symmetric. Diffuse small vessel atheromatous irregularity. POSTERIOR CIRCULATION: Vertebral arteries patent to the vertebrobasilar junction without stenosis. Right  vertebral artery slightly dominant. Patent left PICA. Right PICA not seen. Basilar widely patent to its distal aspect without stenosis. Superior cerebral arteries patent bilaterally. PCA supplied via the basilar as well as prominent bilateral posterior communicating arteries. Right PCA patent to its distal aspect without stenosis. Short-segment moderate left P2 stenosis  again noted. Left PCA otherwise patent. Distal small vessel atheromatous irregularity. No intracranial aneurysm. IMPRESSION: MRI HEAD IMPRESSION: 1. No acute intracranial abnormality identified. 2. Interval evolution of recently identified right basal ganglia infarct, now chronic in appearance. 3. Underlying atrophy with chronic microvascular ischemic disease. MRA HEAD IMPRESSION: 1. Negative intracranial MRA for large vessel occlusion. 2. Focal moderate approximate 50% stenosis involving the mid left P2 segment, stable. 3. Distal small vessel atheromatous irregularity. Electronically Signed   By: Jeannine Boga M.D.   On: 01/27/2020 21:43    Medications:  I have reviewed the patient's current medications. Scheduled: . amLODipine  5 mg Oral Daily  . vitamin C  500 mg Oral Daily  . aspirin EC  325 mg Oral Daily  . atorvastatin  40 mg Oral q1800  . cholecalciferol  1,000 Units Oral Daily  . enoxaparin (LOVENOX) injection  40 mg Subcutaneous Q24H  . NON FORMULARY 1 drop  1 drop Left Eye QHS  . pantoprazole  40 mg Oral Daily  . sodium chloride  1 g Oral TID WC  . vitamin B-12  1,000 mcg Oral Daily    Assessment/Plan: 86 year old female with a history of stroke in December of 2020 who was at home today and had a syncopal episode after which patient was noted to have left facial droop, left sided weakness and difficulty with speech.  Symptoms have now resolved.  Head CT personally reviewed and shows no acute abnormalities.  Symptoms very similar to her presentation in December when she was noted to have an acute right BG infarct.   MRI of the brain personally reviewed and shows evolution of her previous BG infarct but no acute changes.  BP was relatively low on presentation and patient seemed dehydrated with tenting of skin noted.  Patient very likely had unmasking of her prior symptoms with decrease in BP that caused syncope.    LDL 129, A1c 5.4.  Recommendations: 1. EEG pending 2. Orthostatic vitals pending 3. Echocardiogram pending 4. Telemetry 5. Frequent neuro checks 6. Continue ASA 7. Agree with initiation of statin    LOS: 0 days   Alexis Goodell, MD Neurology [phone] 01/28/2020  10:36 AM

## 2020-02-03 DIAGNOSIS — H6123 Impacted cerumen, bilateral: Secondary | ICD-10-CM | POA: Diagnosis not present

## 2020-02-03 DIAGNOSIS — R55 Syncope and collapse: Secondary | ICD-10-CM | POA: Diagnosis not present

## 2020-02-03 DIAGNOSIS — E871 Hypo-osmolality and hyponatremia: Secondary | ICD-10-CM | POA: Diagnosis not present

## 2020-02-03 DIAGNOSIS — I10 Essential (primary) hypertension: Secondary | ICD-10-CM | POA: Diagnosis not present

## 2020-03-13 DIAGNOSIS — Z85828 Personal history of other malignant neoplasm of skin: Secondary | ICD-10-CM | POA: Diagnosis not present

## 2020-03-13 DIAGNOSIS — L400 Psoriasis vulgaris: Secondary | ICD-10-CM | POA: Diagnosis not present

## 2020-03-13 DIAGNOSIS — D2262 Melanocytic nevi of left upper limb, including shoulder: Secondary | ICD-10-CM | POA: Diagnosis not present

## 2020-03-13 DIAGNOSIS — D2271 Melanocytic nevi of right lower limb, including hip: Secondary | ICD-10-CM | POA: Diagnosis not present

## 2020-03-13 DIAGNOSIS — L821 Other seborrheic keratosis: Secondary | ICD-10-CM | POA: Diagnosis not present

## 2020-03-13 DIAGNOSIS — D225 Melanocytic nevi of trunk: Secondary | ICD-10-CM | POA: Diagnosis not present

## 2020-03-13 DIAGNOSIS — L570 Actinic keratosis: Secondary | ICD-10-CM | POA: Diagnosis not present

## 2020-04-10 DIAGNOSIS — G8929 Other chronic pain: Secondary | ICD-10-CM | POA: Diagnosis not present

## 2020-04-10 DIAGNOSIS — M170 Bilateral primary osteoarthritis of knee: Secondary | ICD-10-CM | POA: Diagnosis not present

## 2020-04-10 DIAGNOSIS — M25562 Pain in left knee: Secondary | ICD-10-CM | POA: Diagnosis not present

## 2020-04-10 DIAGNOSIS — M25561 Pain in right knee: Secondary | ICD-10-CM | POA: Diagnosis not present

## 2020-04-23 DIAGNOSIS — E871 Hypo-osmolality and hyponatremia: Secondary | ICD-10-CM | POA: Diagnosis not present

## 2020-04-23 DIAGNOSIS — I69854 Hemiplegia and hemiparesis following other cerebrovascular disease affecting left non-dominant side: Secondary | ICD-10-CM | POA: Diagnosis not present

## 2020-04-30 DIAGNOSIS — Z0000 Encounter for general adult medical examination without abnormal findings: Secondary | ICD-10-CM | POA: Diagnosis not present

## 2020-04-30 DIAGNOSIS — M48062 Spinal stenosis, lumbar region with neurogenic claudication: Secondary | ICD-10-CM | POA: Diagnosis not present

## 2020-04-30 DIAGNOSIS — E538 Deficiency of other specified B group vitamins: Secondary | ICD-10-CM | POA: Diagnosis not present

## 2020-04-30 DIAGNOSIS — E871 Hypo-osmolality and hyponatremia: Secondary | ICD-10-CM | POA: Diagnosis not present

## 2020-07-08 DIAGNOSIS — H401111 Primary open-angle glaucoma, right eye, mild stage: Secondary | ICD-10-CM | POA: Diagnosis not present

## 2020-07-30 DIAGNOSIS — M25562 Pain in left knee: Secondary | ICD-10-CM | POA: Diagnosis not present

## 2020-07-30 DIAGNOSIS — M25561 Pain in right knee: Secondary | ICD-10-CM | POA: Diagnosis not present

## 2020-07-30 DIAGNOSIS — M170 Bilateral primary osteoarthritis of knee: Secondary | ICD-10-CM | POA: Diagnosis not present

## 2020-07-30 DIAGNOSIS — G8929 Other chronic pain: Secondary | ICD-10-CM | POA: Diagnosis not present

## 2020-08-04 DIAGNOSIS — T490X5A Adverse effect of local antifungal, anti-infective and anti-inflammatory drugs, initial encounter: Secondary | ICD-10-CM | POA: Diagnosis not present

## 2020-08-04 DIAGNOSIS — L728 Other follicular cysts of the skin and subcutaneous tissue: Secondary | ICD-10-CM | POA: Diagnosis not present

## 2020-08-04 DIAGNOSIS — L298 Other pruritus: Secondary | ICD-10-CM | POA: Diagnosis not present

## 2020-10-26 DIAGNOSIS — E871 Hypo-osmolality and hyponatremia: Secondary | ICD-10-CM | POA: Diagnosis not present

## 2020-10-26 DIAGNOSIS — E538 Deficiency of other specified B group vitamins: Secondary | ICD-10-CM | POA: Diagnosis not present

## 2020-11-02 DIAGNOSIS — E871 Hypo-osmolality and hyponatremia: Secondary | ICD-10-CM | POA: Diagnosis not present

## 2020-11-02 DIAGNOSIS — E039 Hypothyroidism, unspecified: Secondary | ICD-10-CM | POA: Diagnosis not present

## 2020-11-02 DIAGNOSIS — E538 Deficiency of other specified B group vitamins: Secondary | ICD-10-CM | POA: Diagnosis not present

## 2020-11-02 DIAGNOSIS — M48061 Spinal stenosis, lumbar region without neurogenic claudication: Secondary | ICD-10-CM | POA: Diagnosis not present

## 2020-11-02 DIAGNOSIS — H6122 Impacted cerumen, left ear: Secondary | ICD-10-CM | POA: Diagnosis not present

## 2020-11-16 DIAGNOSIS — D225 Melanocytic nevi of trunk: Secondary | ICD-10-CM | POA: Diagnosis not present

## 2020-11-16 DIAGNOSIS — D0472 Carcinoma in situ of skin of left lower limb, including hip: Secondary | ICD-10-CM | POA: Diagnosis not present

## 2020-11-16 DIAGNOSIS — D0471 Carcinoma in situ of skin of right lower limb, including hip: Secondary | ICD-10-CM | POA: Diagnosis not present

## 2020-11-16 DIAGNOSIS — D2262 Melanocytic nevi of left upper limb, including shoulder: Secondary | ICD-10-CM | POA: Diagnosis not present

## 2020-11-16 DIAGNOSIS — D2271 Melanocytic nevi of right lower limb, including hip: Secondary | ICD-10-CM | POA: Diagnosis not present

## 2020-11-16 DIAGNOSIS — X32XXXA Exposure to sunlight, initial encounter: Secondary | ICD-10-CM | POA: Diagnosis not present

## 2020-11-16 DIAGNOSIS — Z85828 Personal history of other malignant neoplasm of skin: Secondary | ICD-10-CM | POA: Diagnosis not present

## 2020-11-16 DIAGNOSIS — L570 Actinic keratosis: Secondary | ICD-10-CM | POA: Diagnosis not present

## 2020-11-16 DIAGNOSIS — D485 Neoplasm of uncertain behavior of skin: Secondary | ICD-10-CM | POA: Diagnosis not present

## 2020-11-16 DIAGNOSIS — L400 Psoriasis vulgaris: Secondary | ICD-10-CM | POA: Diagnosis not present

## 2020-12-24 DIAGNOSIS — D0471 Carcinoma in situ of skin of right lower limb, including hip: Secondary | ICD-10-CM | POA: Diagnosis not present

## 2020-12-24 DIAGNOSIS — D0472 Carcinoma in situ of skin of left lower limb, including hip: Secondary | ICD-10-CM | POA: Diagnosis not present

## 2021-01-22 DIAGNOSIS — H40052 Ocular hypertension, left eye: Secondary | ICD-10-CM | POA: Diagnosis not present

## 2021-03-05 DIAGNOSIS — M25561 Pain in right knee: Secondary | ICD-10-CM | POA: Diagnosis not present

## 2021-03-05 DIAGNOSIS — M25562 Pain in left knee: Secondary | ICD-10-CM | POA: Diagnosis not present

## 2021-03-05 DIAGNOSIS — I700 Atherosclerosis of aorta: Secondary | ICD-10-CM | POA: Diagnosis not present

## 2021-03-05 DIAGNOSIS — M1712 Unilateral primary osteoarthritis, left knee: Secondary | ICD-10-CM | POA: Diagnosis not present

## 2021-03-05 DIAGNOSIS — G8929 Other chronic pain: Secondary | ICD-10-CM | POA: Diagnosis not present

## 2021-03-05 DIAGNOSIS — R0781 Pleurodynia: Secondary | ICD-10-CM | POA: Diagnosis not present

## 2021-03-05 DIAGNOSIS — M1711 Unilateral primary osteoarthritis, right knee: Secondary | ICD-10-CM | POA: Diagnosis not present

## 2021-04-27 DIAGNOSIS — E039 Hypothyroidism, unspecified: Secondary | ICD-10-CM | POA: Diagnosis not present

## 2021-04-27 DIAGNOSIS — E538 Deficiency of other specified B group vitamins: Secondary | ICD-10-CM | POA: Diagnosis not present

## 2021-05-04 DIAGNOSIS — I69054 Hemiplegia and hemiparesis following nontraumatic subarachnoid hemorrhage affecting left non-dominant side: Secondary | ICD-10-CM | POA: Diagnosis not present

## 2021-05-04 DIAGNOSIS — Z79899 Other long term (current) drug therapy: Secondary | ICD-10-CM | POA: Diagnosis not present

## 2021-05-04 DIAGNOSIS — Z0000 Encounter for general adult medical examination without abnormal findings: Secondary | ICD-10-CM | POA: Diagnosis not present

## 2021-05-04 DIAGNOSIS — E538 Deficiency of other specified B group vitamins: Secondary | ICD-10-CM | POA: Diagnosis not present

## 2021-05-17 DIAGNOSIS — D2262 Melanocytic nevi of left upper limb, including shoulder: Secondary | ICD-10-CM | POA: Diagnosis not present

## 2021-05-17 DIAGNOSIS — D225 Melanocytic nevi of trunk: Secondary | ICD-10-CM | POA: Diagnosis not present

## 2021-05-17 DIAGNOSIS — L821 Other seborrheic keratosis: Secondary | ICD-10-CM | POA: Diagnosis not present

## 2021-05-17 DIAGNOSIS — D2261 Melanocytic nevi of right upper limb, including shoulder: Secondary | ICD-10-CM | POA: Diagnosis not present

## 2021-05-17 DIAGNOSIS — L570 Actinic keratosis: Secondary | ICD-10-CM | POA: Diagnosis not present

## 2021-05-17 DIAGNOSIS — Z85828 Personal history of other malignant neoplasm of skin: Secondary | ICD-10-CM | POA: Diagnosis not present

## 2021-05-17 DIAGNOSIS — D2271 Melanocytic nevi of right lower limb, including hip: Secondary | ICD-10-CM | POA: Diagnosis not present

## 2021-05-17 DIAGNOSIS — X32XXXA Exposure to sunlight, initial encounter: Secondary | ICD-10-CM | POA: Diagnosis not present

## 2021-07-10 DIAGNOSIS — R531 Weakness: Secondary | ICD-10-CM | POA: Diagnosis not present

## 2021-07-13 DIAGNOSIS — R5382 Chronic fatigue, unspecified: Secondary | ICD-10-CM | POA: Diagnosis not present

## 2021-07-16 ENCOUNTER — Other Ambulatory Visit: Payer: Self-pay

## 2021-07-16 ENCOUNTER — Encounter: Payer: Self-pay | Admitting: Emergency Medicine

## 2021-07-16 ENCOUNTER — Emergency Department
Admission: EM | Admit: 2021-07-16 | Discharge: 2021-07-16 | Disposition: A | Discharge status: Home / Self Care | Payer: PPO | Attending: Emergency Medicine | Admitting: Emergency Medicine

## 2021-07-16 ENCOUNTER — Emergency Department: Payer: PPO

## 2021-07-16 DIAGNOSIS — R5383 Other fatigue: Secondary | ICD-10-CM | POA: Insufficient documentation

## 2021-07-16 DIAGNOSIS — Z79899 Other long term (current) drug therapy: Secondary | ICD-10-CM | POA: Insufficient documentation

## 2021-07-16 DIAGNOSIS — Z8673 Personal history of transient ischemic attack (TIA), and cerebral infarction without residual deficits: Secondary | ICD-10-CM | POA: Insufficient documentation

## 2021-07-16 DIAGNOSIS — R109 Unspecified abdominal pain: Secondary | ICD-10-CM | POA: Diagnosis not present

## 2021-07-16 DIAGNOSIS — Z87891 Personal history of nicotine dependence: Secondary | ICD-10-CM | POA: Diagnosis not present

## 2021-07-16 DIAGNOSIS — Z20822 Contact with and (suspected) exposure to covid-19: Secondary | ICD-10-CM | POA: Diagnosis not present

## 2021-07-16 DIAGNOSIS — R531 Weakness: Secondary | ICD-10-CM | POA: Insufficient documentation

## 2021-07-16 DIAGNOSIS — R52 Pain, unspecified: Secondary | ICD-10-CM | POA: Diagnosis not present

## 2021-07-16 DIAGNOSIS — K769 Liver disease, unspecified: Secondary | ICD-10-CM

## 2021-07-16 DIAGNOSIS — I10 Essential (primary) hypertension: Secondary | ICD-10-CM | POA: Insufficient documentation

## 2021-07-16 DIAGNOSIS — R1084 Generalized abdominal pain: Secondary | ICD-10-CM | POA: Diagnosis not present

## 2021-07-16 DIAGNOSIS — R0902 Hypoxemia: Secondary | ICD-10-CM | POA: Diagnosis not present

## 2021-07-16 DIAGNOSIS — R42 Dizziness and giddiness: Secondary | ICD-10-CM | POA: Diagnosis not present

## 2021-07-16 DIAGNOSIS — R911 Solitary pulmonary nodule: Secondary | ICD-10-CM | POA: Diagnosis not present

## 2021-07-16 DIAGNOSIS — R1010 Upper abdominal pain, unspecified: Secondary | ICD-10-CM | POA: Diagnosis not present

## 2021-07-16 DIAGNOSIS — D7389 Other diseases of spleen: Secondary | ICD-10-CM

## 2021-07-16 LAB — BASIC METABOLIC PANEL
Anion gap: 9 (ref 5–15)
BUN: 11 mg/dL (ref 8–23)
CO2: 26 mmol/L (ref 22–32)
Calcium: 8.8 mg/dL — ABNORMAL LOW (ref 8.9–10.3)
Chloride: 94 mmol/L — ABNORMAL LOW (ref 98–111)
Creatinine, Ser: 0.78 mg/dL (ref 0.44–1.00)
GFR, Estimated: >60 mL/min (ref >60)
Glucose, Bld: 108 mg/dL — ABNORMAL HIGH (ref 70–99)
Potassium: 3.8 mmol/L (ref 3.5–5.1)
Sodium: 129 mmol/L — ABNORMAL LOW (ref 135–145)

## 2021-07-16 LAB — CBC
HCT: 36.1 % (ref 36.0–46.0)
Hemoglobin: 12.8 g/dL (ref 12.0–15.0)
MCH: 31.7 pg (ref 26.0–34.0)
MCHC: 35.5 g/dL (ref 30.0–36.0)
MCV: 89.4 fL (ref 80.0–100.0)
Platelets: 181 10*3/uL (ref 150–400)
RBC: 4.04 MIL/uL (ref 3.87–5.11)
RDW: 15.5 % (ref 11.5–15.5)
WBC: 5.8 10*3/uL (ref 4.0–10.5)
nRBC: 0 % (ref 0.0–0.2)

## 2021-07-16 LAB — URINALYSIS, COMPLETE (UACMP) WITH MICROSCOPIC
Bacteria, UA: NONE SEEN
Bilirubin Urine: NEGATIVE
Glucose, UA: NEGATIVE mg/dL
Hgb urine dipstick: NEGATIVE
Ketones, ur: 20 mg/dL — AB
Leukocytes,Ua: NEGATIVE
Nitrite: NEGATIVE
Protein, ur: NEGATIVE mg/dL
Specific Gravity, Urine: 1.015 (ref 1.005–1.030)
pH: 6 (ref 5.0–8.0)

## 2021-07-16 LAB — HEPATIC FUNCTION PANEL
ALT: 16 U/L (ref 0–44)
AST: 66 U/L — ABNORMAL HIGH (ref 15–41)
Albumin: 2.8 g/dL — ABNORMAL LOW (ref 3.5–5.0)
Alkaline Phosphatase: 86 U/L (ref 38–126)
Bilirubin, Direct: 0.3 mg/dL — ABNORMAL HIGH (ref 0.0–0.2)
Indirect Bilirubin: 0.9 mg/dL (ref 0.3–0.9)
Total Bilirubin: 1.2 mg/dL (ref 0.3–1.2)
Total Protein: 5.6 g/dL — ABNORMAL LOW (ref 6.5–8.1)

## 2021-07-16 LAB — RESP PANEL BY RT-PCR (FLU A&B, COVID) ARPGX2
Influenza A by PCR: NEGATIVE
Influenza B by PCR: NEGATIVE
SARS Coronavirus 2 by RT PCR: NEGATIVE

## 2021-07-16 LAB — LIPASE, BLOOD: Lipase: 27 U/L (ref 11–51)

## 2021-07-16 MED ORDER — LACTATED RINGERS IV BOLUS
500.0000 mL | Freq: Once | INTRAVENOUS | Status: AC
  Administered 2021-07-16: 500 mL via INTRAVENOUS

## 2021-07-16 MED ORDER — IOHEXOL 350 MG/ML SOLN
50.0000 mL | Freq: Once | INTRAVENOUS | Status: AC | PRN
  Administered 2021-07-16: 50 mL via INTRAVENOUS

## 2021-07-16 NOTE — ED Triage Notes (Signed)
Pt comes via EMs with c/o RUQ pain for about 2 weeks. Pt states it is sharp in pain  Temp-100.9 CBG-123 20 g lac, 100 of fluids given

## 2021-07-16 NOTE — ED Notes (Signed)
Pt taken for CT 

## 2021-07-16 NOTE — Discharge Instructions (Addendum)
As we discussed your imaging is concerning for metastatic cancer. Please follow up with oncology for further work up. Please seek medical attention for any high fevers, chest pain, shortness of breath, change in behavior, persistent vomiting, bloody stool or any other new or concerning symptoms.

## 2021-07-16 NOTE — ED Notes (Signed)
Pt ambulatory to restroom with staff assist x1. Assisted back to bed and given warm blanket. Stretcher locked in low position with side rails up x2, call light in reach.

## 2021-07-16 NOTE — ED Provider Notes (Signed)
[facility] Emergency Department Provider Note   ____________________________________________   Event Date/Time   First MD Initiated Contact with Patient 07/16/21 1405     (approximate)  I have reviewed the triage vital signs and the nursing notes.   HISTORY  Chief Complaint Dizziness and Abdominal Pain    HPI ERILYNN Fernandez is a 87 y.o. female with past medical history of hypertension, hyperlipidemia, stroke, and GERD who presents to the ED complaining of generalized weakness.  Patient reports that she has been feeling increasingly weak over the past 5 days.  She states her weakness and affects her entire body equally and that she feels extremely fatigued.  She has not had any fevers, endorses a chronic cough that is unchanged today and nonproductive.  She has not had any pain in her chest or difficulty breathing.  She does endorse 2 weeks of diffuse abdominal discomfort, which she describes as constant and mild.  She has not had any dysuria, hematuria, or flank pain.  She has been following with her PCP for this problem and was told that her sodium level was low, subsequently started on salt tabs.  She continues to feel worse and decided to seek care in the ED.        Past Medical History:  Diagnosis Date   GERD (gastroesophageal reflux disease)    Glaucoma    Vasovagal syncope     Patient Active Problem List   Diagnosis Date Noted   HTN (hypertension) 01/27/2020   HLD (hyperlipidemia) 01/27/2020   Stroke ([place]) 01/27/2020   GERD (gastroesophageal reflux disease) 01/27/2020   Acute ischemic stroke ([place]) 11/06/2019   Hypertensive urgency 11/06/2019   Altered mental status 11/04/2019   TIA (transient ischemic attack) 11/19/2017   Syncope 05/04/2017    Past Surgical History:  Procedure Laterality Date   ABDOMINAL HYSTERECTOMY     EYE SURGERY      Prior to Admission medications   Medication Sig Start Date End Date Taking? Authorizing  Provider  amLODipine (NORVASC) 5 MG tablet Take 1 tablet (5 mg total) by mouth daily. 11/10/19 01/27/20  Dhungel, Flonnie Overman, MD  cholecalciferol (VITAMIN D3) 25 MCG (1000 UNIT) tablet Take 1,000 Units by mouth daily.    [provider]  Cranberry 400 MG CAPS Take 800 mg by mouth 2 (two) times daily.    [provider]  diclofenac sodium (VOLTAREN) 1 % GEL Apply 4 g topically 4 (four) times daily as needed (pain).     [provider]  omeprazole (PRILOSEC) 20 MG capsule Take 20 mg by mouth daily.     [provider]  sodium chloride 1 g tablet Take 1 g by mouth 3 (three) times daily with meals.     [provider]  Travoprost, BAK Free, (TRAVATAN) 0.004 % SOLN ophthalmic solution Place 1 drop into the left eye at bedtime.    [provider]  vitamin B-12 (CYANOCOBALAMIN) 1000 MCG tablet Take 1,000 mcg by mouth daily.     [provider]  vitamin C (ASCORBIC ACID) 500 MG tablet Take 500 mg by mouth daily.    [provider]    Allergies Salicylates, Alendronate, Amoxil [amoxicillin], Azopt [brinzolamide], Deltasone [prednisone], Erythromycin, Lumigan [bimatoprost], Meloxicam, Ofloxacin, Parafon forte dsc [chlorzoxazone], and Prevacid [lansoprazole]  Family History  Problem Relation Age of Onset   Transient ischemic attack Mother     Social History Social History   Tobacco Use   Smoking status: Former  Types: Cigarettes   Smokeless tobacco: Never  Vaping Use   Vaping Use: Never used  Substance Use Topics   Alcohol use: No   Drug use: No    Review of Systems  Constitutional: No fever/chills.  Positive for generalized weakness and fatigue. Eyes: No visual changes. ENT: No sore throat. Cardiovascular: Denies chest pain. Respiratory: Denies shortness of breath. Gastrointestinal: Positive for abdominal pain.  No nausea, no vomiting.  No diarrhea.  No constipation. Genitourinary: Negative for  dysuria. Musculoskeletal: Negative for back pain. Skin: Negative for rash. Neurological: Negative for headaches, focal weakness or numbness.  ____________________________________________   PHYSICAL EXAM:  VITAL SIGNS: ED Triage Vitals  Enc Vitals Group     BP 07/16/21 1143 (!) 159/78     Pulse Rate 07/16/21 1143 91     Resp 07/16/21 1143 20     Temp 07/16/21 1143 98.7 F (37.1 C)     Temp Source 07/16/21 1143 Oral     SpO2 07/16/21 1143 93 %     Weight 07/16/21 1145 110 lb (49.9 kg)     Height 07/16/21 1145 '4\' 10"'$  (1.473 m)     Head Circumference --      Peak Flow --      Pain Score 07/16/21 1144 5     Pain Loc --      Pain Edu? --      Excl. in [place]? --     Constitutional: Alert and oriented. Eyes: Conjunctivae are normal. Head: Atraumatic. Nose: No congestion/rhinnorhea. Mouth/Throat: Mucous membranes are moist. Neck: Normal ROM Cardiovascular: Normal rate, regular rhythm. Grossly normal heart sounds.  2+ radial pulses bilaterally. Respiratory: Normal respiratory effort.  No retractions. Lungs CTAB. Gastrointestinal: Soft and diffusely tender to palpation with no rebound or guarding.  No distention. Genitourinary: deferred Musculoskeletal: No lower extremity tenderness nor edema. Neurologic:  Normal speech and language. No gross focal neurologic deficits are appreciated. Skin:  Skin is warm, dry and intact. No rash noted. Psychiatric: Mood and affect are normal. Speech and behavior are normal.  ____________________________________________   LABS (all labs ordered are listed, but only abnormal results are displayed)  Labs Reviewed  BASIC METABOLIC PANEL - Abnormal; Notable for the following components:      Result Value   Sodium 129 (*)    Chloride 94 (*)    Glucose, Bld 108 (*)    Calcium 8.8 (*)    All other components within normal limits  URINALYSIS, COMPLETE (UACMP) WITH MICROSCOPIC - Abnormal; Notable for the following components:   Color, Urine YELLOW  (*)    APPearance HAZY (*)    Ketones, ur 20 (*)    All other components within normal limits  HEPATIC FUNCTION PANEL - Abnormal; Notable for the following components:   Total Protein 5.6 (*)    Albumin 2.8 (*)    AST 66 (*)    Bilirubin, Direct 0.3 (*)    All other components within normal limits  RESP PANEL BY RT-PCR (FLU A&B, COVID) ARPGX2  CBC  LIPASE, BLOOD  CBG MONITORING, ED   ____________________________________________  EKG  ED ECG REPORT I, Blake Divine, the attending physician, personally viewed and interpreted this ECG.   Date: 07/16/2021  EKG Time: 11:48  Rate: 96  Rhythm: normal sinus rhythm  Axis: Normal  Intervals:none  ST&T Change: None   PROCEDURES  Procedure(s) performed (including Critical Care):  Procedures   ____________________________________________   INITIAL IMPRESSION / ASSESSMENT AND PLAN / ED COURSE  87 year old female with past medical history of hypertension, hyperlipidemia, stroke, and GERD who presents to the ED with 5 days of gradually worsening generalized weakness associated with abdominal pain.  Patient has diffuse tenderness to palpation on exam which we will further assess with CT scan.  Initial labs are remarkable for hyponatremia, which has been gradually worsening despite initiation of salt tabs by her PCP.  She has no focal neurologic deficits on exam and I doubt stroke or other intracranial process.  We will add on LFTs and lipase, UA and chest x-ray are pending to assess for infectious process.  We will also perform testing for COVID-19.  UA shows no signs of infection, chest x-ray reviewed by me and shows no infiltrate, edema, or effusion.  CT results are pending and patient turned over to oncoming provider pending imaging results and reassessment.      ____________________________________________   FINAL CLINICAL IMPRESSION(S) / ED DIAGNOSES  Final diagnoses:  Generalized weakness  Generalized abdominal  pain     ED Discharge Orders     None        Note:  This document was prepared using Dragon voice recognition software and may include unintentional dictation errors.    Blake Divine, MD 07/16/21 [phone]

## 2021-07-16 NOTE — ED Triage Notes (Signed)
Pt reports that she has had dizziness and abd pain for the last few weeks. She is also feeling very weak. She had and appointment to follow up with Dr. Sabra Heck because her NA+ has been low, but they thought it would be better if she came here to be evaluated.

## 2021-07-16 NOTE — ED Provider Notes (Signed)
CT abd/pel Enlarged spleen with masses. Focal hypodensity seen in hepatic lobe. Nodule in left lower lobe.   Discussed CT findings with patient and daughter at bedside. Did discuss concern for metastatic cancer. Patient did feel better after IVFs and was able to ambulate without significant dizziness or weakness. At this time will plan on discharging patient. Discussed follow up with oncology.   Nance Pear, MD 07/16/21 2009

## 2021-07-16 NOTE — ED Notes (Signed)
Pt complaining on L sided abd pain after she had an episode of constipation about 2 weeks ago- pt states she took milk of mag and miralax to try to help and has had a few small BMs since but has become increasingly weak since then

## 2021-07-22 ENCOUNTER — Inpatient Hospital Stay: Payer: PPO | Admitting: Internal Medicine

## 2021-07-22 ENCOUNTER — Inpatient Hospital Stay: Payer: PPO

## 2021-08-03 DIAGNOSIS — E785 Hyperlipidemia, unspecified: Secondary | ICD-10-CM | POA: Diagnosis not present

## 2021-08-03 DIAGNOSIS — C801 Malignant (primary) neoplasm, unspecified: Secondary | ICD-10-CM | POA: Diagnosis not present

## 2021-08-03 DIAGNOSIS — E871 Hypo-osmolality and hyponatremia: Secondary | ICD-10-CM | POA: Diagnosis not present

## 2021-08-03 DIAGNOSIS — I10 Essential (primary) hypertension: Secondary | ICD-10-CM | POA: Diagnosis not present

## 2021-08-03 DIAGNOSIS — C7889 Secondary malignant neoplasm of other digestive organs: Secondary | ICD-10-CM | POA: Diagnosis not present

## 2021-08-03 DIAGNOSIS — C7802 Secondary malignant neoplasm of left lung: Secondary | ICD-10-CM | POA: Diagnosis not present

## 2021-08-03 DIAGNOSIS — C787 Secondary malignant neoplasm of liver and intrahepatic bile duct: Secondary | ICD-10-CM | POA: Diagnosis not present

## 2021-08-06 IMAGING — DX DG CHEST 1V PORT
1 series · 1 of 1 positions shown · non-contrast
Comparison: Chest radiograph dated 01/30/2019.

CLINICAL DATA: 89-year-old female with near syncope.

EXAM:
PORTABLE CHEST 1 VIEW

[chest ap]
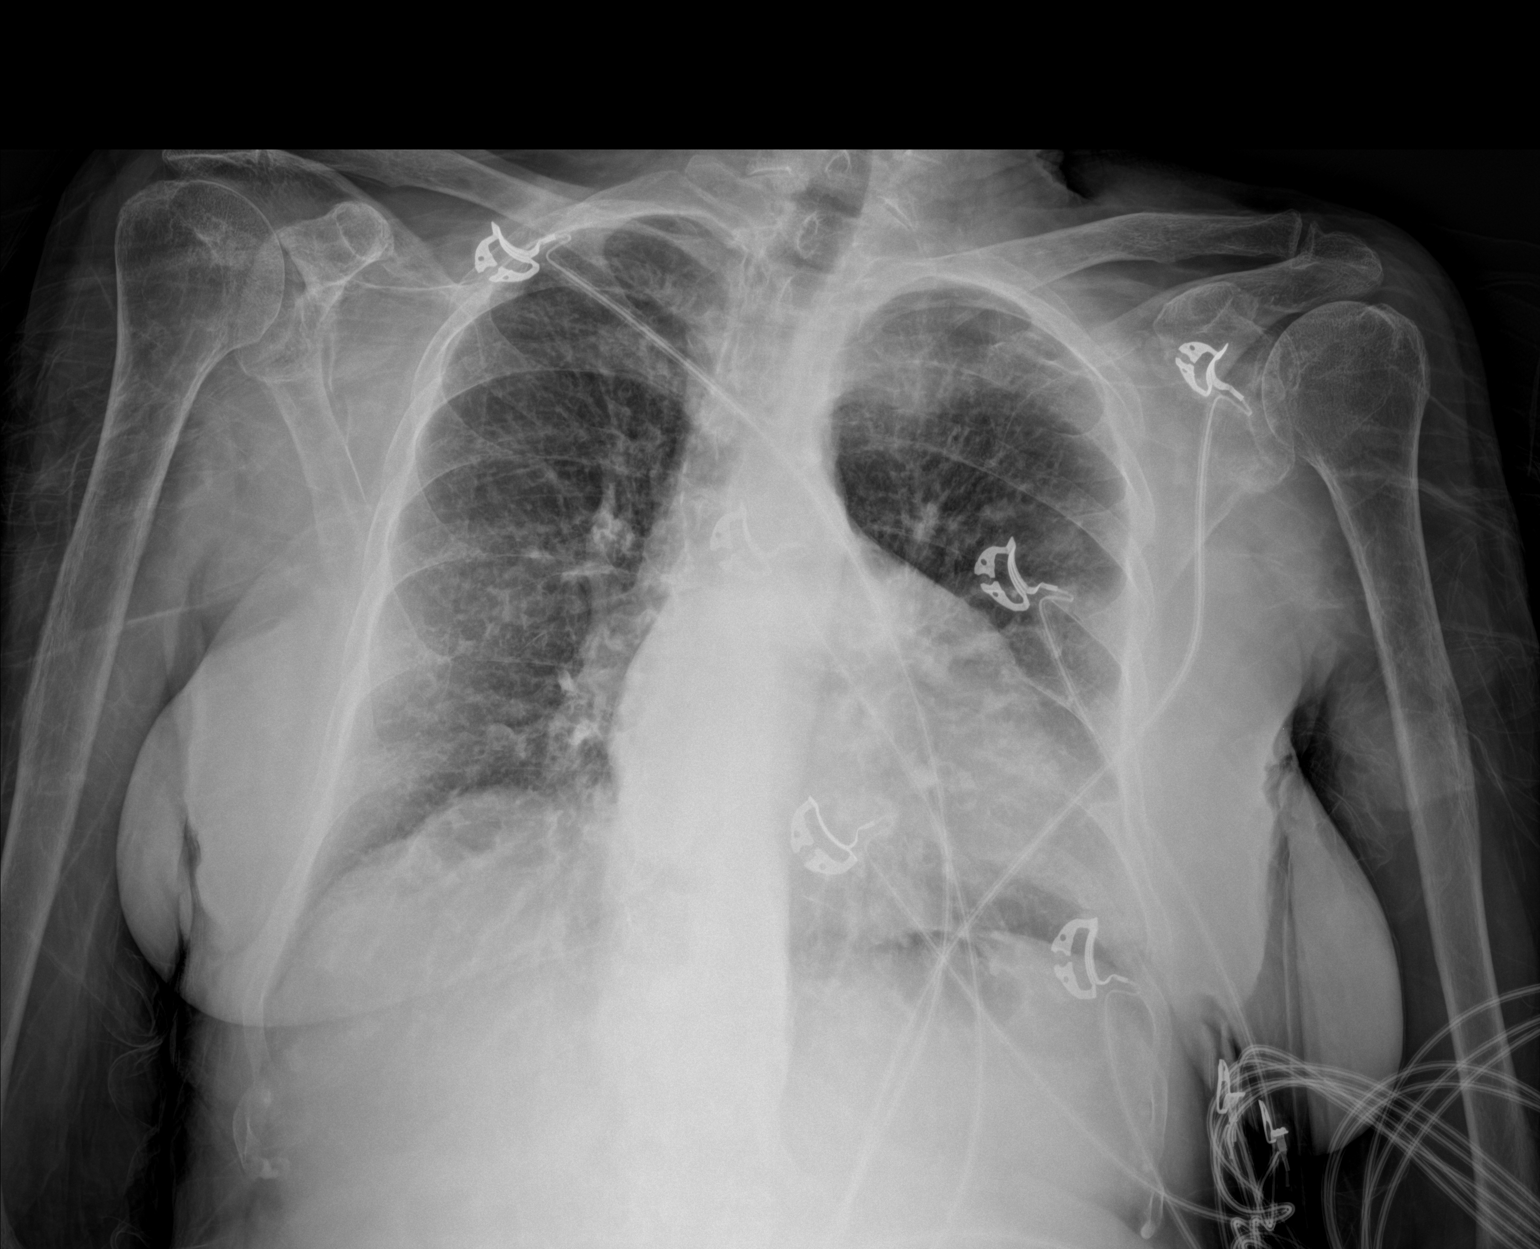

[1 of 1 positions shown; findings below may reference images not displayed]

FINDINGS: Mild diffuse interstitial prominence. No focal consolidation,
pleural effusion, or pneumothorax. Stable cardiac silhouette. No
acute osseous pathology. Osteopenia.
IMPRESSION: 1. No acute cardiopulmonary process.
2. Stable mild interstitial prominence.

## 2021-08-06 IMAGING — CT CT HEAD W/O CM
3 series · 16 of 47 positions shown, 19 images · non-contrast
Comparison: Head CT 11/19/2017, brain MRI 11/20/2017

CLINICAL DATA: Altered level of consciousness. Near syncope.

EXAM:
CT HEAD WITHOUT CONTRAST
TECHNIQUE: Contiguous axial images were obtained from the base of the skull
through the vertex without intravenous contrast.

[Series 3: head wo · axial · 0.41mm/px · z∈[-133,-8]mm · 10 of 30 slices shown, 13 images]
[im 3/30  brain]
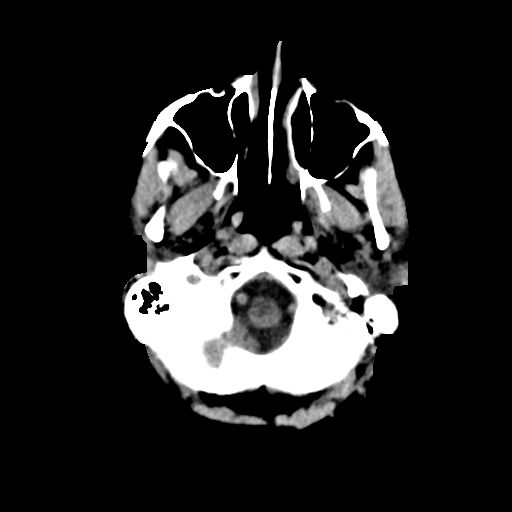
[im 3/30  bone]
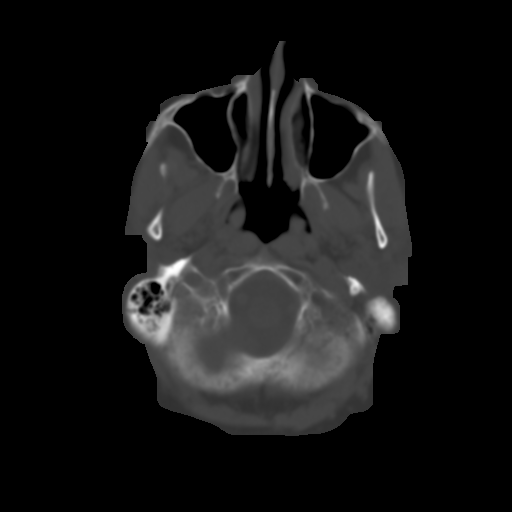
[im 6/30  brain]
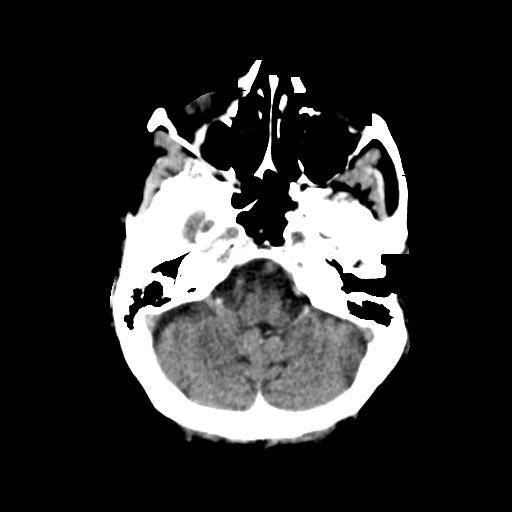
[im 9/30  brain]
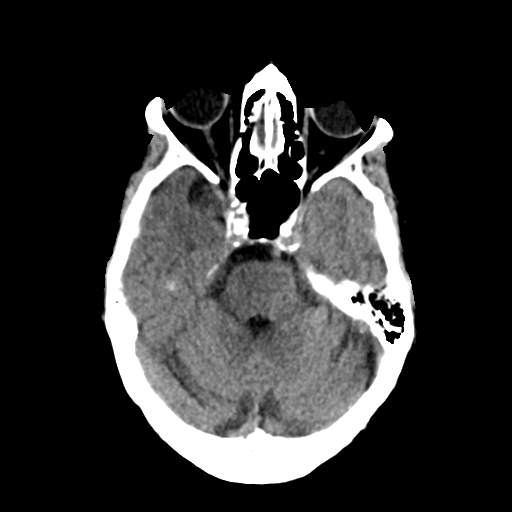
[im 11/30  brain]
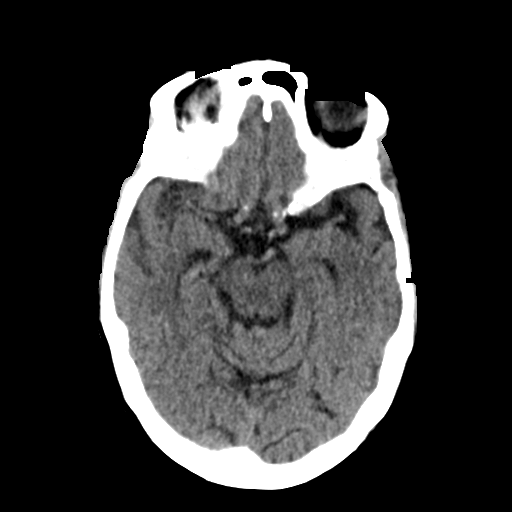
[im 14/30  brain]
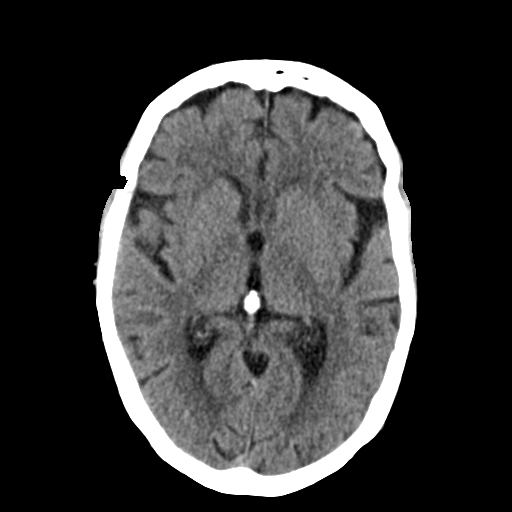
[im 14/30  bone]
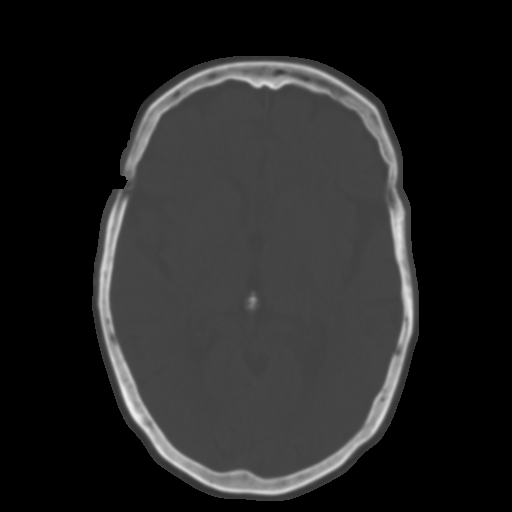
[im 17/30  brain]
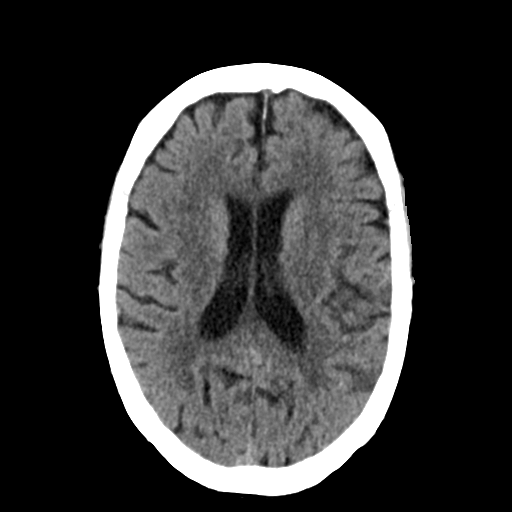
[im 20/30  brain]
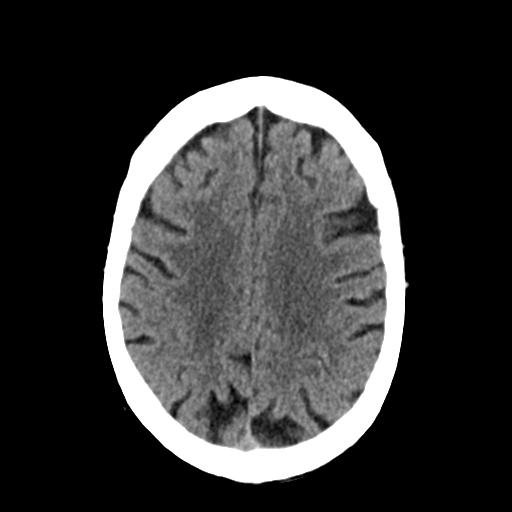
[im 23/30  brain]
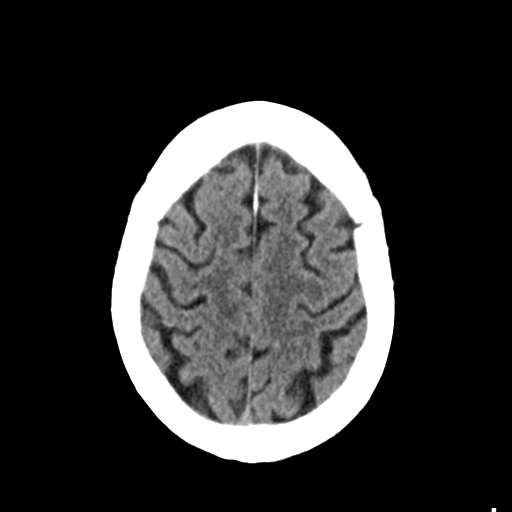
[im 25/30  brain]
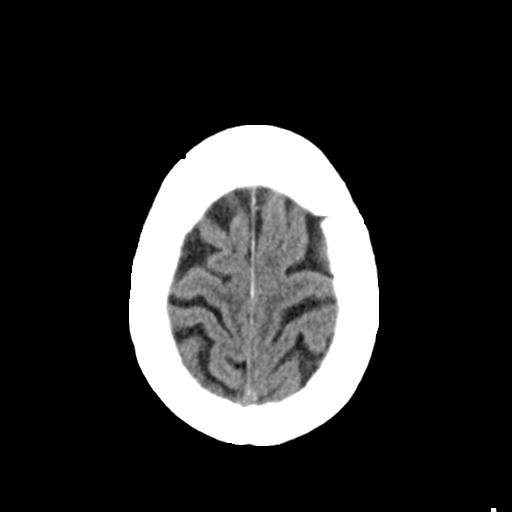
[im 25/30  bone]
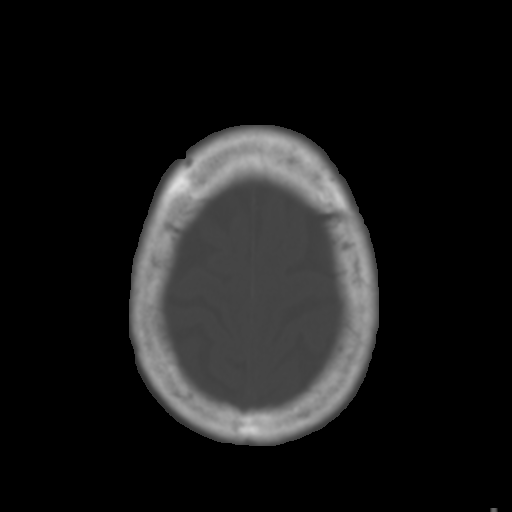
[im 28/30  brain]
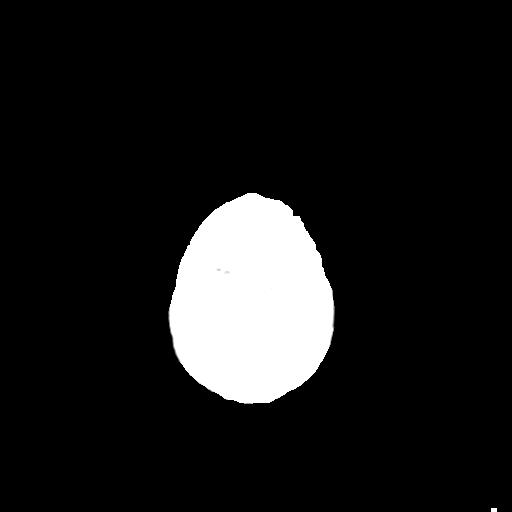

[Series 4: coronal soft tissue · coronal · 0.28mm/px · 3 of 60 slices shown]
[im 20/60  brain]
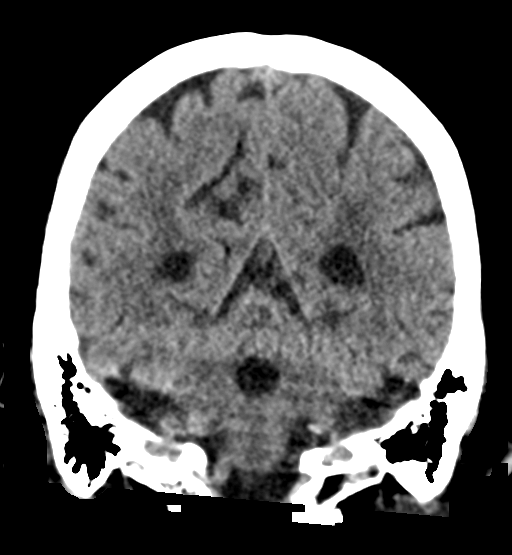
[im 27/60  brain]
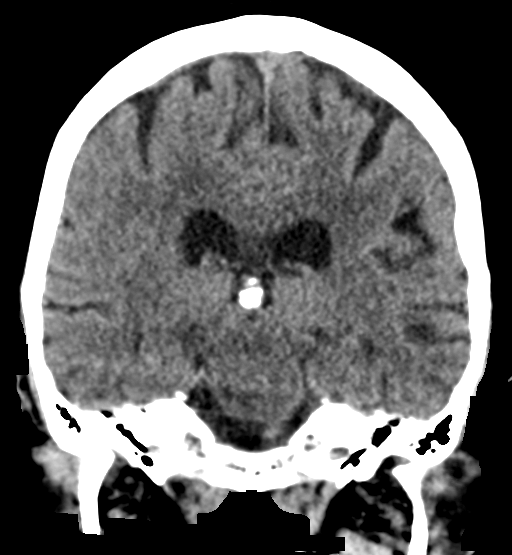
[im 33/60  brain]
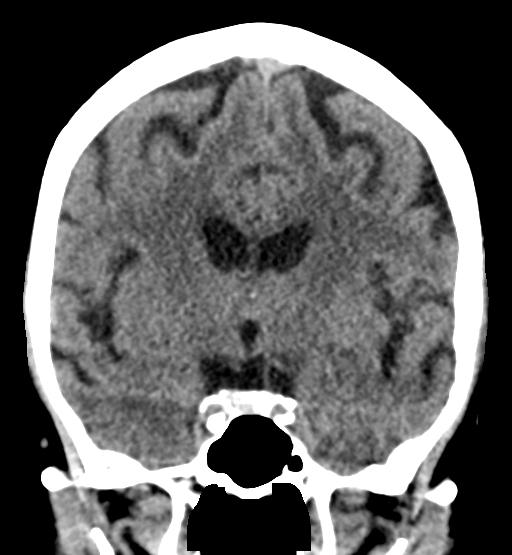

[Series 5: sagittal soft tissue · sagittal · 0.30mm/px · 3 of 44 slices shown]
[im 15/44  brain]
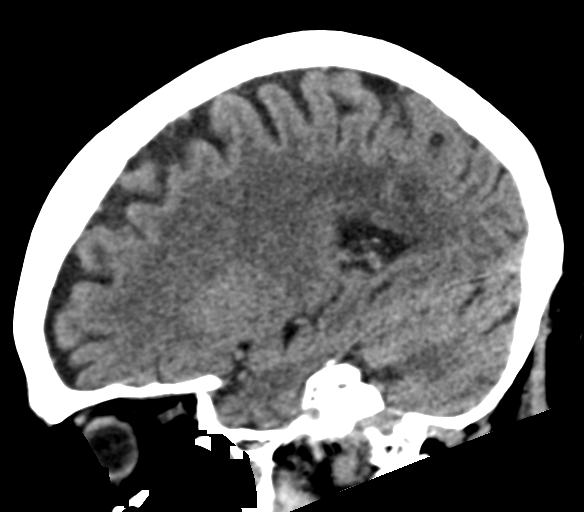
[im 22/44  brain]
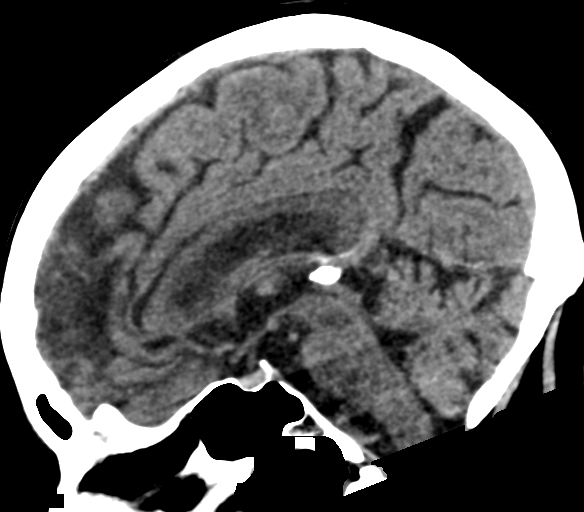
[im 29/44  brain]
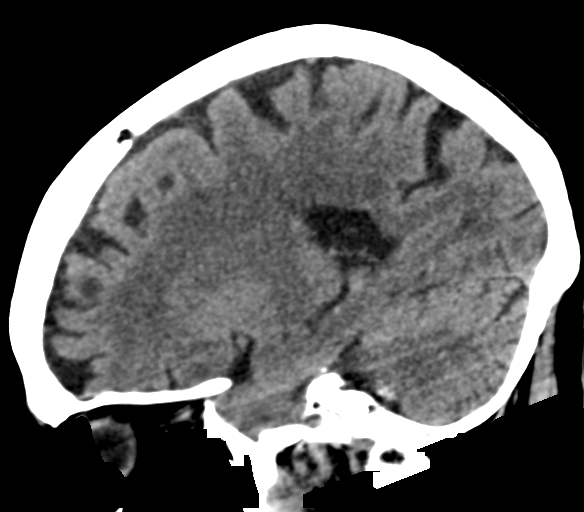

[16 of 47 positions shown; findings below may reference images not displayed]

FINDINGS: Brain: No intracranial hemorrhage, mass effect, or midline shift.
Brain volume is normal for age. Unchanged chronic small vessel
ischemia. No hydrocephalus. The basilar cisterns are patent. No
evidence of territorial infarct or acute ischemia. No extra-axial or
intracranial fluid collection.

Vascular: Atherosclerosis of skullbase vasculature without
hyperdense vessel or abnormal calcification.

Skull: No fracture or focal lesion.

Sinuses/Orbits: Paranasal sinuses and mastoid air cells are clear.
The visualized orbits are unremarkable. Bilateral lens extraction.

Other: None.
IMPRESSION: 1. No acute intracranial abnormality.
2. Unchanged age related atrophy and chronic small vessel ischemia.

## 2021-08-28 DEATH — deceased
